# Patient Record
Sex: Male | Born: 1945
Health system: Southern US, Community
[De-identification: ages and names within clinical notes are randomized; demographics above are authoritative.]

## PROBLEM LIST (undated history)

## (undated) DIAGNOSIS — K219 Gastro-esophageal reflux disease without esophagitis: Secondary | ICD-10-CM

## (undated) DIAGNOSIS — I10 Essential (primary) hypertension: Secondary | ICD-10-CM

## (undated) DIAGNOSIS — Z955 Presence of coronary angioplasty implant and graft: Secondary | ICD-10-CM

## (undated) DIAGNOSIS — H35349 Macular cyst, hole, or pseudohole, unspecified eye: Secondary | ICD-10-CM

## (undated) DIAGNOSIS — M109 Gout, unspecified: Secondary | ICD-10-CM

## (undated) DIAGNOSIS — I2510 Atherosclerotic heart disease of native coronary artery without angina pectoris: Secondary | ICD-10-CM

## (undated) DIAGNOSIS — Z903 Acquired absence of stomach [part of]: Secondary | ICD-10-CM

## (undated) DIAGNOSIS — R7303 Prediabetes: Secondary | ICD-10-CM

## (undated) DIAGNOSIS — D126 Benign neoplasm of colon, unspecified: Secondary | ICD-10-CM

## (undated) DIAGNOSIS — Z87891 Personal history of nicotine dependence: Secondary | ICD-10-CM

## (undated) DIAGNOSIS — M797 Fibromyalgia: Secondary | ICD-10-CM

## (undated) DIAGNOSIS — Z789 Other specified health status: Secondary | ICD-10-CM

## (undated) DIAGNOSIS — E785 Hyperlipidemia, unspecified: Secondary | ICD-10-CM

## (undated) DIAGNOSIS — Z8719 Personal history of other diseases of the digestive system: Secondary | ICD-10-CM

## (undated) DIAGNOSIS — E669 Obesity, unspecified: Secondary | ICD-10-CM

## (undated) HISTORY — DX: Benign neoplasm of colon, unspecified: D12.6

## (undated) HISTORY — DX: Gout, unspecified: M10.9

## (undated) HISTORY — PX: CORONARY STENT PLACEMENT: SHX1402

## (undated) HISTORY — DX: Fibromyalgia: M79.7

## (undated) HISTORY — PX: STOMACH SURGERY: SHX791

## (undated) HISTORY — DX: Macular cyst, hole, or pseudohole, unspecified eye: H35.349

## (undated) HISTORY — DX: Gastro-esophageal reflux disease without esophagitis: K21.9

## (undated) HISTORY — DX: Other specified health status: Z78.9

## (undated) HISTORY — DX: Essential (primary) hypertension: I10

## (undated) HISTORY — PX: TONSILLECTOMY: SUR1361

## (undated) HISTORY — DX: Personal history of nicotine dependence: Z87.891

## (undated) HISTORY — DX: Obesity, unspecified: E66.9

## (undated) HISTORY — DX: Prediabetes: R73.03

## (undated) HISTORY — DX: Atherosclerotic heart disease of native coronary artery without angina pectoris: I25.10

---

## 2001-10-22 ENCOUNTER — Encounter: Admission: RE | Admit: 2001-10-22 | Discharge: 2001-12-10 | Payer: Self-pay | Admitting: Geriatric Medicine

## 2002-06-14 ENCOUNTER — Emergency Department (HOSPITAL_COMMUNITY): Admission: EM | Admit: 2002-06-14 | Discharge: 2002-06-14 | Payer: Self-pay | Admitting: Emergency Medicine

## 2002-06-14 ENCOUNTER — Encounter: Payer: Self-pay | Admitting: Emergency Medicine

## 2005-01-27 ENCOUNTER — Inpatient Hospital Stay (HOSPITAL_COMMUNITY): Admission: EM | Admit: 2005-01-27 | Discharge: 2005-01-29 | Payer: Self-pay | Admitting: *Deleted

## 2005-02-21 ENCOUNTER — Encounter (HOSPITAL_COMMUNITY): Admission: RE | Admit: 2005-02-21 | Discharge: 2005-05-22 | Payer: Self-pay | Admitting: Cardiology

## 2006-12-20 ENCOUNTER — Ambulatory Visit (HOSPITAL_COMMUNITY): Admission: RE | Admit: 2006-12-20 | Discharge: 2006-12-20 | Payer: Self-pay | Admitting: Cardiology

## 2007-07-12 ENCOUNTER — Emergency Department (HOSPITAL_COMMUNITY): Admission: EM | Admit: 2007-07-12 | Discharge: 2007-07-12 | Payer: Self-pay | Admitting: Emergency Medicine

## 2009-11-03 ENCOUNTER — Encounter: Admission: RE | Admit: 2009-11-03 | Discharge: 2009-11-03 | Payer: Self-pay | Admitting: Geriatric Medicine

## 2010-10-24 ENCOUNTER — Encounter: Payer: Self-pay | Admitting: Geriatric Medicine

## 2011-02-18 NOTE — Consult Note (Signed)
NAMEPETR, BONTEMPO               ACCOUNT NO.:  [id number]   MEDICAL RECORD NO.:  [id number]          PATIENT TYPE:  INP   LOCATION:  3715                         FACILITY:  [facility]   PHYSICIAN:  Francisca December, M.D.  DATE OF BIRTH:  1946-08-14   DATE OF CONSULTATION:  01/27/2005  DATE OF DISCHARGE:                                   CONSULTATION   REASON FOR CONSULTATION:  Chest pain.   HISTORY OF PRESENT ILLNESS:  Mr. Arthur Shaw is a pleasant 65 year old man  who had the onset today of dyspnea at rest.  He had been out walking the  dog.  It was around 4:00 in the morning.  He returned to bed and became  rapidly short of breath.  He had to sit up and do purselip breathing.  Shortly thereafter, he developed a pressure sensation in the epigastrium  that did not radiate.  It was not associated with nausea, but there was mild  diaphoresis and some tingling in the left arm.  The symptoms persisted for  approximately 45 minutes and then resolved.  He was brought to the emergency  room, and by the time of arrival he was asymptomatic.  Of note, he has had  stress myocardial perfusion studies done, last on January 25, 2004, that  showed a questionable inferobasal reversible defect felt most likely to be  diaphragmatic attenuation.  This was unchanged from a study done in 1997.   PAST MEDICAL HISTORY:  1.  Hypertension.  2.  Obesity.  3.  Questionable elevated lipids.  4.  Borderline diabetes.  5.  GERD.  6.  Seasonal allergies.  7.  Recent diagnosis of colon polyps.   MEDICATIONS:  1.  Metoprolol, ? dose.  2.  Norvasc, ? dose.  3.  Aspirin 81 mg p.o. daily.  4.  Nexium 40 mg p.o. daily.  5.  The patient is using frequent Afrin each evening for nasal congestion.   DRUG ALLERGIES:  None known.   FAMILY HISTORY:  Father has had coronary disease but not after the age of  21.  Mother has had heart failure and CVA.   SOCIAL HISTORY:  He is married.  Wife and children in the room  today.  He is  the Garment/textile technologist for [employer].  Uses no tobacco.   REVIEW OF SYSTEMS:  Significant for this chronic nasal congestion which is  felt to be allergic in nature.  He has been using Afrin each night.  Denies  any tachypalpitation.  Has attempted nasal steroids in the past without much  success.   PHYSICAL EXAMINATION:  VITAL SIGNS:  Blood pressure 143/93; pulse 58;  respirations 20; temperature 98.3.  GENERAL:  This is a well-appearing, mildly obese 65 year old man in no  distress.  HEENT:  Unremarkable.  NECK:  Supple, without thyromegaly or masses.  The carotid upstrokes are  normal.  There is no bruit.  There is no jugular venous distention.  CHEST:  Clear, with adequate excursion.  HEART:  Regular rhythm.  Normal S1 and S2.  No murmur, click, or rub.  ABDOMEN:  Soft, nontender.  No midline pulsatile mass.  No epigastric  tenderness.  No hepatomegaly.  EXTREMITIES:  Showed full range of motion.  No edema.  Intact distal pulses.  NEUROLOGIC:  Cranial nerves II-XII are intact.  Motor and sensory are  grossly intact.  Gait not tested.  SKIN:  Warm, dry, and clear.   ACCESSORY CLINICAL DATA:  Chest CT was negative for pulmonary embolism.  There was questionable lower lobe atelectasis.  Gallstones are present.  Questionable renal cyst.   Admission hemogram, serum electrolytes, BUN, creatinine, glucose, liver-  associated enzymes, D-dimer, and cardiac markers all negative.  Initial CK-  MB per the lab:  CK 54, MB 1, troponin less than 0.01.  Electrocardiogram:  Normal sinus rhythm, normal EKG.   IMPRESSION:  1.  Atypical angina, 65 year old man, multiple risk factors, currently in      high-stress position.  Thus far, he has ruled out for myocardial      infarction.  2.  Hypertension, marginal control.  3.  Modest obesity.  4.  GERD.   PLAN:  I have recommended that the patient undergo and he has accepted  cardiac catheterization, coronary angiography,  possible PCI.  Goals, risks,  and alternatives were discussed at length.  The patient states his  understanding, has had his questions answered, and wishes to proceed.  Will  administer aspirin and anxiolytics this evening.   Thank you very much for allowing me to assist in the care of Mr. Arthur Shaw.  It has been a pleasure to do so.  I will discuss his further care  with you.      JHE/MEDQ  D:  01/27/2005  T:  01/28/2005  Job:  [id number]   cc:   Hal T. Stoneking, M.D.  [address] [place], [place] [address]  Fax: [phone]

## 2011-02-18 NOTE — Cardiovascular Report (Signed)
Arthur Shaw, Arthur Shaw               ACCOUNT NO.:  [id number]   MEDICAL RECORD NO.:  [id number]          PATIENT TYPE:  OIB   LOCATION:  2899                         FACILITY:  [facility]   PHYSICIAN:  Francisca December, M.D.  DATE OF BIRTH:  06-Mar-1946   DATE OF PROCEDURE:  12/20/2006  DATE OF DISCHARGE:                            CARDIAC CATHETERIZATION   PROCEDURE PERFORMED:  1. Left heart catheterization.  2. Left ventriculogram.  3. Coronary angiography.   INDICATION:  Arthur Shaw is a 65 year old man with known ASCVD.  He is  approximately 23 months status post PCI with DE-stent implantation mid  LAD.  He has recently had an episode of atypical chest pain.  A  myocardial perfusion study was performed and showed a reversible  inferior inferoseptal defect.  The stress portion of the study was  normal.  He is brought to the catheterization laboratory at this time to  identify the extent of disease and provide further therapeutic options.   PROCEDURE NOTE:  The patient is brought to the cardiac catheterization  laboratory in the fasting state.  The right groin was prepped and draped  in the usual sterile fashion.  Local anesthesia was obtained with  infiltration of 1% lidocaine.  A 5-French catheter sheath was inserted  percutaneously into the right femoral artery utilizing an anterior  approach over a guiding J-wire.  Left heart catheterization and coronary  angiography then proceeded in the standard fashion using 5-French #4  left and right Judkins catheters and a 110-cm pigtail catheter.  A 30-  degree RAO cine left ventriculogram was formed utilizing a power  injector.  At the completion of the procedure the catheter and catheter  sheath were removed.  Hemostasis was achieved by direct pressure.  The  patient was transported to the recovery area in stable condition with an  intact distal pulse.   HEMODYNAMICS:  Systemic arterial pressure was 135/84 with a mean of 109  mmHg.   There was no systolic gradient across the aortic valve.  The left  ventricular end-diastolic pressure was 19 mmHg pre ventriculogram.   ANGIOGRAPHY:  The left ventriculogram demonstrated normal chamber size  and normal global systolic function without regional wall motion  abnormality.  A visual estimate of the ejection fraction is 65%.  There  is no coronary calcification seen.  The stent in the mid portion of the  anterior descending artery is easily visible.  There is no mitral  regurgitation and the aortic valve is trileaflet and opens normally  during systole.   There is a right-dominant coronary system present.  The main left  coronary artery is normal.   The left anterior descending artery and its branches are moderately  diseased; there is a 20% focal narrowing in the proximal portion of the  artery just before the origin of a large first diagonal branch, and then  there is another 20% stenosis in the mid portion of the artery just  before the stented segment.  The stented segment itself is widely patent  but does demonstrate a small amount of in-stent restenosis in the 20-25%  range in the mid portion of the stent.  There is a second diagonal  branch which is moderate in size which arises from within the stented  segment and is widely patent.  There is a 30% stenosis at its origin.  The ongoing anterior descending artery is relatively small, reaches but  does not traverse the apex.   The left circumflex coronary artery and its branches are without  significant obstruction.  The vessel gives rise to two moderate-size  marginal branches.  The first marginal branch bifurcates into two  smaller branches.  The second marginal branch is without obstruction and  on the true obtuse margin of the heart.  There are no obstructions of a  significance seen in the left circumflex system.   The right coronary artery is very large and dominant.  There is a mid  vessel 20% stenosis which  is focal just before the origin of the right  ventricular branch.  There are luminal irregularities in the rest of the  mid portion and in the distal portion of the right coronary.  The distal  vessel bifurcates into a large posterior descending artery which extends  to and perfuses the apex.  There is also a fairly large posterolateral  segment which gives rise to three small- to moderate-sized left  ventricular branches.  There are no obstructions in these distal  branches of the right coronary.   Collateral vessels are not seen.   FINAL IMPRESSION:  1. Atherosclerotic cardiovascular disease, single vessel.  2. Widely patent coronary arteries in general.  3. Intact left ventricular size and global systolic function, ejection      fraction 65%.  4. Mildly elevated left ventricular end-diastolic pressure.  5. Falsely abnormal exercise Cardiolite perfusion images.   PLAN:  The patient is presented with this gratifying news.  No changes  will be made in his medical therapy.  He will continue routine followup  with myself.      Francisca December, M.D.  Electronically Signed     JHE/MEDQ  D:  12/20/2006  T:  12/20/2006  Job:  [id number]   cc:   Hal T. Stoneking, M.D.

## 2011-02-18 NOTE — H&P (Signed)
NAMESAHEJ, SCHRIEBER NO.:  [id number]   MEDICAL RECORD NO.:  [id number]          PATIENT TYPE:  EMS   LOCATION:  MAJO                         FACILITY:  [facility]   PHYSICIAN:  Deirdre Peer. Polite, M.D. DATE OF BIRTH:  1946-06-10   DATE OF ADMISSION:  01/27/2005  DATE OF DISCHARGE:                                HISTORY & PHYSICAL   CHIEF COMPLAINT:  Chest pain.   HISTORY OF PRESENT ILLNESS:  Mr. Jowers is a 65 year old male with known  history of hypertension, borderline hypercholesterolemia, borderline  diabetes, morbid obesity who presents to the ED after having an episode of  chest pain this a.m.  Patient states he was in his usual state of health  this a.m.  He got up approximately 4 a.m. to go out and walk the dog.  Patient stated he did not walk no further than the front yard but when he  came back into the house had a sensation of chest pressure 2/10 with some  radiation to the right side of the chest and some tingling in his left arm.  Patient also experienced some shortness of breath and diaphoresis.  Patient  stated symptoms lasted for approximately half an hour.  Patient's wife  brought him to the ED.  En route to the ED patient's symptoms abated.  In  the ED the patient was pain-free.  EKG and point of care enzymes were within  normal limits.  Because of the patient's risk factors and known family  history of coronary artery disease, [facility] hospitalists have been called for  further evaluation and treatment.  At the time of my evaluation patient was  alert and oriented x3, no apparent distress.  Symptoms as stated above.  Patient states that he normally does not have any orthopnea or PND, no leg  swelling.  Patient states that he can walk several blocks without any  symptoms.  Patient has had chest pain symptoms before which necessitated a  stress test less than 12 months ago performed by Dr. Garnette Scheuermann.  According  to the patient results were within normal  limits.  However, again as stated,  because of the patient's multiple risk factors admission is deemed necessary  for further evaluation and treatment.   PAST MEDICAL HISTORY:  1.  As stated above, significant for hypertension.  2.  Obesity.  3.  Seasonal allergies.  4.  Borderline hypercholesterolemia.  5.  Borderline diabetes.  6.  Recent diagnosis of colonic polyps.  7.  GERD.   MEDICATIONS:  1.  Metoprolol b.i.d.  Patient is unsure of his dose.  2.  Aspirin 81 mg daily.  3.  Norvasc.  The patient is unsure of his dose.  4.  Nexium daily.  5.  Afrin p.r.n. which the patient states he uses on a daily basis.   SOCIAL HISTORY:  Social alcohol.  No tobacco.  No drugs.   PAST SURGICAL HISTORY:  None.   FAMILY HISTORY:  Father known history of coronary artery disease, status  post CABG, now with CHF.  Mother history of CHF and CVA.  Brother  known  history of coronary artery disease.   REVIEW OF SYSTEMS:  As stated in HPI, otherwise negative.   PHYSICAL EXAMINATION:  VITAL SIGNS:  Temperature 97.3, blood pressure  152/98, pulse 79, respiratory rate 18, saturation 100%.  HEENT:  Pupils equal, round, reactive to light.  Anicteric sclera.  No oral  lesions.  No nodes.  No JVD.  CHEST:  Clear to auscultation bilaterally.  CARDIOVASCULAR:  Regular.  No S3.  ABDOMEN:  Obese.  No hepatosplenomegaly.  EXTREMITIES:  No clubbing, cyanosis, edema.  2+ pulse.  NEUROLOGIC:  Nonfocal.   DATA:  Chest x-ray shows bibasilar nodular densities.  Recommend CT.  BMET:  Sodium 133, potassium 6.5, chloride 107, BUN 15, creatinine 0.8.  Point of  care enzyme:  Myoglobin 83, CK-MB less than 1, troponin I less than 0.05.  D-  dimer less than 0.22.  AST and ALT 20 and 13.  CBC:  White count 8.1,  hemoglobin 15.9, hematocrit 44, platelet 250.  Repeat BMET shows corrected  potassium of 3.7, glucose of 159.  Reported potassium of 6.5 was on an I-  stat.   ASSESSMENT:  1.  Chest pain in a patient with  multiple risk factors for coronary artery      disease.  Please note patient has had a negative stress test within the      last 12 months.  2.  Hypertension.  3.  Borderline high cholesterol.  4.  Borderline diabetes.  5.  Morbid obesity.  6.  Gastroesophageal reflux disease.  7.  Abnormal chest x-ray showing bibasilar nodular densities.  8.  Hyperkalemia.  However, please note repeat laboratory shows potassium      within normal limits.   RECOMMENDATIONS:  Recommend patient be admitted to a medicine telemetry bed.  Patient will have serial cardiac enzymes, will be started on aspirin, beta  blocker.  Will check lipids, hemoglobin A1C, and a TSH.  Patient will have  cardiac evaluation for further risk stratification.  As the patient had an  abnormal chest x-ray, will obtain a CT as recommended to rule out any  underlying pathology.  Will make further recommendations after review of the  above studies.  Thank you in advance.      RDP/MEDQ  D:  01/27/2005  T:  01/27/2005  Job:  [id number]   cc:   Hal T. Stoneking, M.D.  [address] [place], [place] [address]  Fax: [phone]

## 2011-02-18 NOTE — Cardiovascular Report (Signed)
Arthur Shaw, Arthur Shaw               ACCOUNT NO.:  [id number]   MEDICAL RECORD NO.:  [id number]          PATIENT TYPE:  INP   LOCATION:  6524                         FACILITY:  [facility]   PHYSICIAN:  Francisca December, M.D.  DATE OF BIRTH:  1945/11/19   DATE OF PROCEDURE:  01/28/2005  DATE OF DISCHARGE:                              CARDIAC CATHETERIZATION   PROCEDURE PERFORMED:  1.  Left heart catheterization.  2.  Left ventriculogram.  3.  Coronary angiography.  4.  PCI/drug-eluting stent implantation mid anterior descending artery.  5.  Percutaneous closure right femoral artery.   INDICATION:  Mr. Arthur Shaw is a 65 year old man who was admitted  yesterday with atypical angina and dyspnea.  He ruled out for myocardial  infarction.  Has had previous myocardial perfusion studies that were  questionable for inferior wall defect.  He is brought now to the cardiac  catheterization laboratory to identify the extent of disease and provide for  further therapeutic options.   PROCEDURAL NOTE:  The patient was brought to the cardiac catheterization  laboratory in the fasting state.  The right groin was prepped and draped in  the usual sterile fashion.  Local anesthesia was obtained with the  infiltration of 1% lidocaine.  A 5 French catheter sheath was inserted  percutaneously into the right femoral artery utilizing an anterior approach  over a guiding J wire.  Left heart catheterization and coronary angiography  were then conducted using 5 French pigtail and #4 left and right Judkins  catheters in a standard fashion.  The patient did receive sublingual  nitroglycerin 0.4 mg prior to the coronary angiography.  A left  ventriculogram was performed in the 30-degree RAO angulation using a power  injector.  Diagnostic images were reviewed and a 90% stenosis in the mid  anterior descending artery at the origin of the second diagonal branch was  identified.  I proceeded with percutaneous  intervention.  The 5 French  catheter sheath was exchanged for 6 French catheter sheath over long guiding  J wire.  The patient received 5900 units of heparin intravenously resulting  in an ACT of 306 seconds. He also received a double bolus and constant  infusion of integrilin. A 6 French #3.0 CLS guiding catheter was advanced to  the ascending aorta where the left coronary os was engaged.  A 0.014-inch  Luge intracoronary guide wire was passed across the lesion in the distal LAD  without difficulty.  A second Luge guide wire was advanced into the diagonal  branch.  The lesion was primarily stented using a 2.75/16-mm Scimed Taxus  intracoronary drug-eluting stent.  This was carefully positioned across the  lesion and inflated to 12 atmospheres for 40 seconds.  This stent balloon  was deflated and removed.  The diagonal wire was then removed from the  diagonal.  An attempt to pass it through the stent struts and into the  diagonal was unsuccessful.  A 0.014-inch Whisper wire was successfully  crossed into the diagonal. However, I was unable to advance a 2.0 or a 1.5  mm Maverick balloon.  Finally,  the diagonal wire was removed and the stent  was post dilated using a 3.0/12 mm Quantum Maverick inflated to 12  atmospheres for 30 seconds.  This balloon was deflated and removed and the  Whisper wire was returned to the circulation again crossing into the  diagonal branch which was now subtotally occluded with significantly reduced  flow.  At this point, I was able to successfully pass a 1.5/15 mm Maverick  into the diagonal branch.  This was inflated to 9 atmospheres for 19  seconds.  It was deflated and removed and a 2.0/12 mm Maverick intracoronary  balloon was positioned into the ostium of the diagonal and inflated three  times to a peak pressure of 6 atmospheres for not greater than 30 seconds.  Finally, this balloon and wire were removed and a 3.0/12 mm Quantum Maverick  again advanced  into the stented LAD segment and inflated to 9 atmospheres  for 40 seconds.  Finally, this balloon was deflated and removed, and after  confirming adequate patency in orthogonal views both with and without the  guide wire in place, the guiding catheter was removed.  A right femoral  arteriogram in a 45-degree RAO angulation via the catheter sheath by hand  injection documented adequate anatomy for placement of the percutaneous  closure device Angio-Seal.  This was successfully deployed with good  hemostasis and an intact distal pulse.  The patient was transported to the  recovery area in stable condition with an intact distal pulse.   HEMODYNAMICS:  Systemic arterial pressure was 135/88 with a of 110 mmHg.  There was no systolic gradient across the aortic valve.  The left  ventricular end-diastolic pressure was 18 mmHg pre ventriculogram.   ANGIOGRAPHY:  The left ventriculogram demonstrated normal chamber size and  normal global systolic function without regional wall motion abnormality.  A  visual estimate of the ejection fraction is 65%.  There is no mitral  regurgitation.  There was no coronary calcification and the aortic valve was  trileaflet and opened normally during systole.   There was a right dominant coronary system present.  The main left coronary  artery was normal.   The left anterior descending artery and its branches were highly diseased.  There is a 20% proximal stenosis followed by the origin of a large first  diagonal branch.  Then between the first and second diagonal branches there  is a 20-30% stenosis.  After the second diagonal branch, but involving the  origin of the diagonal there was a 90% LAD stenosis.  There was a 30%  stenosis in the origin of a first septal perforator that arose just before  the origin of the second diagonal branch.  The second diagonal branch did  not have any significant obstruction in its origin.  The ongoing anterior descending artery  reached, but did not traverse the apex.   The left circumflex coronary artery and its branches were normal.  The  dominant vessel on the lateral wall is the first diagonal.  The circumflex  was relatively small giving rise to only two marginal branches.  The first  of which is relatively small.  The second one is moderate in size and was on  the posterior wall from the heart from the mid to the base only.   The right coronary artery and its branches were large and dominant.  There  was a 20% mid vessel stenosis and luminal irregularities in the mid and  distal segment, but no significant  obstruction.  The distal segment  bifurcates into a very large posterior descending artery and a large  posterior lateral segment with two large left ventricular branches.  The  posterior descending artery and posterior lateral branches contain no  obstructions.  The posterior descending artery perfuses the apex.   Following balloon dilatation, stent implantation in the anterior descending  artery there was a 10% residual stenosis which I accepted rather than high  pressure dilate the LAD stent and subsequently occlude the diagonal again.  There was a 60% residual stenosis in the origin of the diagonal.   FINAL IMPRESSION:  1.  Atherosclerotic coronary vascular disease, single vessel.  2.  Intact left ventricular size and global systolic function.  3.  Status post successful percutaneous coronary intervention/drug-eluting      stent implantation mid anterior descending artery with adjunctive      balloon angioplasty of a second diagonal branch placed in stent jail.  4.  Typical angina was not reproduced with device insertion or balloon      inflation.      JHE/MEDQ  D:  01/28/2005  T:  01/28/2005  Job:  [id number]   cc:   Hal T. Stoneking, M.D.  [address] [place], [place] [address]  Fax: [phone]

## 2011-07-14 LAB — URINALYSIS, ROUTINE W REFLEX MICROSCOPIC
Bilirubin Urine: NEGATIVE
Glucose, UA: 100 — AB
Hgb urine dipstick: NEGATIVE
Ketones, ur: NEGATIVE
Nitrite: NEGATIVE
Protein, ur: NEGATIVE
Specific Gravity, Urine: 1.01
Urobilinogen, UA: 0.2
pH: 6.5

## 2011-07-14 LAB — COMPREHENSIVE METABOLIC PANEL
Albumin: 3.6
Alkaline Phosphatase: 85
BUN: 13
Chloride: 102
Creatinine, Ser: 0.9
GFR calc non Af Amer: >60
Glucose, Bld: 227 — ABNORMAL HIGH
Potassium: 3.5
Total Bilirubin: 0.8

## 2011-07-14 LAB — DIFFERENTIAL
Basophils Absolute: 0.1
Basophils Relative: 1
Lymphocytes Relative: 19
Monocytes Absolute: 0.6
Neutro Abs: 7.1
Neutrophils Relative %: 72

## 2011-07-14 LAB — CBC
HCT: 43.1
MCHC: 32.7
Platelets: 273
RDW: 17.3 — ABNORMAL HIGH

## 2011-07-14 LAB — CK TOTAL AND CKMB (NOT AT ARMC): CK, MB: 1.3

## 2011-07-14 LAB — TROPONIN I: Troponin I: 0.02

## 2011-12-06 ENCOUNTER — Emergency Department (INDEPENDENT_AMBULATORY_CARE_PROVIDER_SITE_OTHER): Payer: 59

## 2011-12-06 ENCOUNTER — Emergency Department (HOSPITAL_BASED_OUTPATIENT_CLINIC_OR_DEPARTMENT_OTHER)
Admission: EM | Admit: 2011-12-06 | Discharge: 2011-12-06 | Disposition: A | Discharge status: Home / Self Care | Payer: 59 | Attending: Emergency Medicine | Admitting: Emergency Medicine

## 2011-12-06 ENCOUNTER — Encounter (HOSPITAL_BASED_OUTPATIENT_CLINIC_OR_DEPARTMENT_OTHER): Payer: Self-pay | Admitting: *Deleted

## 2011-12-06 ENCOUNTER — Other Ambulatory Visit: Payer: Self-pay

## 2011-12-06 DIAGNOSIS — K7689 Other specified diseases of liver: Secondary | ICD-10-CM

## 2011-12-06 DIAGNOSIS — R079 Chest pain, unspecified: Secondary | ICD-10-CM | POA: Insufficient documentation

## 2011-12-06 DIAGNOSIS — K219 Gastro-esophageal reflux disease without esophagitis: Secondary | ICD-10-CM

## 2011-12-06 DIAGNOSIS — R110 Nausea: Secondary | ICD-10-CM

## 2011-12-06 DIAGNOSIS — I10 Essential (primary) hypertension: Secondary | ICD-10-CM | POA: Insufficient documentation

## 2011-12-06 DIAGNOSIS — D3500 Benign neoplasm of unspecified adrenal gland: Secondary | ICD-10-CM

## 2011-12-06 DIAGNOSIS — E785 Hyperlipidemia, unspecified: Secondary | ICD-10-CM | POA: Insufficient documentation

## 2011-12-06 DIAGNOSIS — M549 Dorsalgia, unspecified: Secondary | ICD-10-CM

## 2011-12-06 DIAGNOSIS — J9819 Other pulmonary collapse: Secondary | ICD-10-CM

## 2011-12-06 DIAGNOSIS — M546 Pain in thoracic spine: Secondary | ICD-10-CM | POA: Insufficient documentation

## 2011-12-06 HISTORY — DX: Hyperlipidemia, unspecified: E78.5

## 2011-12-06 LAB — LIPASE, BLOOD: Lipase: 31 U/L (ref 11–59)

## 2011-12-06 LAB — CARDIAC PANEL(CRET KIN+CKTOT+MB+TROPI)
CK, MB: 1.8 ng/mL (ref 0.3–4.0)
CK, MB: 1.9 ng/mL (ref 0.3–4.0)
Total CK: 61 U/L (ref 7–232)
Total CK: 61 U/L (ref 7–232)
Troponin I: <0.3 ng/mL (ref <0.30)
Troponin I: <0.3 ng/mL (ref <0.30)

## 2011-12-06 LAB — COMPREHENSIVE METABOLIC PANEL
ALT: 14 U/L (ref 0–53)
Albumin: 3.8 g/dL (ref 3.5–5.2)
Calcium: 9.2 mg/dL (ref 8.4–10.5)
GFR calc Af Amer: >90 mL/min (ref >90)
Glucose, Bld: 168 mg/dL — ABNORMAL HIGH (ref 70–99)
Potassium: 3.3 mEq/L — ABNORMAL LOW (ref 3.5–5.1)
Sodium: 136 mEq/L (ref 135–145)
Total Protein: 7.2 g/dL (ref 6.0–8.3)

## 2011-12-06 LAB — DIFFERENTIAL
Eosinophils Absolute: 0.5 10*3/uL (ref 0.0–0.7)
Lymphocytes Relative: 27 % (ref 12–46)
Lymphs Abs: 2.7 10*3/uL (ref 0.7–4.0)
Monocytes Relative: 8 % (ref 3–12)
Neutro Abs: 6 10*3/uL (ref 1.7–7.7)
Neutrophils Relative %: 60 % (ref 43–77)

## 2011-12-06 LAB — CBC
Hemoglobin: 14.6 g/dL (ref 13.0–17.0)
MCH: 28.6 pg (ref 26.0–34.0)
Platelets: 241 10*3/uL (ref 150–400)
RBC: 5.11 MIL/uL (ref 4.22–5.81)
WBC: 10 10*3/uL (ref 4.0–10.5)

## 2011-12-06 MED ORDER — IOHEXOL 350 MG/ML SOLN
80.0000 mL | Freq: Once | INTRAVENOUS | Status: AC | PRN
  Administered 2011-12-06: 80 mL via INTRAVENOUS

## 2011-12-06 MED ORDER — SUCRALFATE 1 GM/10ML PO SUSP: 1.0000 g | Freq: Four times a day (QID) | ORAL | Status: DC

## 2011-12-06 MED ORDER — GI COCKTAIL ~~LOC~~
30.0000 mL | Freq: Once | ORAL | Status: AC
  Administered 2011-12-06: 30 mL via ORAL

## 2011-12-06 MED ORDER — FENTANYL CITRATE 0.05 MG/ML IJ SOLN
50.0000 ug | Freq: Once | INTRAMUSCULAR | Status: DC
  Filled 2011-12-06: qty 2

## 2011-12-06 MED ORDER — GI COCKTAIL ~~LOC~~
ORAL | Status: AC
  Filled 2011-12-06: qty 30

## 2011-12-06 NOTE — ED Notes (Signed)
Pt refuses offer of fentanyl or any other narcotic. States he has to work later today. EDP made aware and order discontinued.

## 2011-12-06 NOTE — ED Notes (Signed)
Patient states that he woke up with upper back pain, between his shoulder blades. Left shoulder pain that radiates to his left arm. Also states that he is nausea as well.

## 2011-12-06 NOTE — ED Provider Notes (Signed)
History     CSN: [id number]  Arrival date & time 12/06/11  0210   First MD Initiated Contact with Patient 12/06/11 [phone]      Chief Complaint  Patient presents with  . Back Pain    (Consider location/radiation/quality/duration/timing/severity/associated sxs/prior treatment) Patient is a 66 y.o. male presenting with back pain. The history is provided by the patient. No language interpreter was used.  Back Pain  This is a new problem. The current episode started 1 to 2 hours ago. The problem occurs constantly. The problem has not changed since onset.The pain is associated with no known injury. The pain is present in the thoracic spine. The quality of the pain is described as stabbing. Radiates to: LUE. The pain is at a severity of 10/10. The pain is severe. Exacerbated by: nothing. Worse during: awoke from sleep with associated nausea no SOB. Stiffness is present: none. Pertinent negatives include no fever, no numbness, no headaches, no abdominal pain, no perianal numbness, no bladder incontinence, no leg pain, no paresthesias and no weakness. He has tried nothing for the symptoms. The treatment provided no relief. Risk factors include obesity.    Past Medical History  Diagnosis Date  . Hypertension   . Hyperlipidemia     Past Surgical History  Procedure Date  . Coronary stent placement     No family history on file.  History  Substance Use Topics  . Smoking status: Never Smoker   . Smokeless tobacco: Not on file  . Alcohol Use: No      Review of Systems  Constitutional: Negative for fever.  HENT: Negative.   Eyes: Negative.   Respiratory: Negative for shortness of breath.   Cardiovascular: Negative for palpitations and leg swelling.  Gastrointestinal: Positive for nausea. Negative for abdominal pain and abdominal distention.  Genitourinary: Negative.  Negative for bladder incontinence.  Musculoskeletal: Positive for back pain.  Skin: Negative.   Neurological: Negative  for weakness, numbness, headaches and paresthesias.  Hematological: Negative.   Psychiatric/Behavioral: Negative.     Allergies  Statins  Home Medications   Current Outpatient Rx  Name Route Sig Dispense Refill  . AMLODIPINE BESY-BENAZEPRIL HCL 5-40 MG PO CAPS Oral Take 1 capsule by mouth daily.    . ASPIRIN 81 MG PO TABS Oral Take 81 mg by mouth daily.    . AZELASTINE HCL 137 MCG/SPRAY NA SOLN Nasal Place 1 spray into the nose 2 (two) times daily. Use in each nostril as directed    . CELECOXIB 200 MG PO CAPS Oral Take 200 mg by mouth 2 (two) times daily.    Marland Kitchen CLOPIDOGREL BISULFATE 75 MG PO TABS Oral Take 75 mg by mouth daily.    . CYCLOBENZAPRINE HCL 10 MG PO TABS Oral Take 10 mg by mouth 3 (three) times daily as needed.    Marland Kitchen ESZOPICLONE 2 MG PO TABS Oral Take 2 mg by mouth at bedtime. Take immediately before bedtime    . EZETIMIBE 10 MG PO TABS Oral Take 10 mg by mouth daily.    . FUROSEMIDE 20 MG PO TABS Oral Take 20 mg by mouth 2 (two) times daily.    Marland Kitchen METAXALONE 800 MG PO TABS Oral Take 800 mg by mouth 3 (three) times daily.    Marland Kitchen METOPROLOL TARTRATE 50 MG PO TABS Oral Take 50 mg by mouth 2 (two) times daily.    Marland Kitchen NITROGLYCERIN 0.4 MG/HR TD PT24 Transdermal Place 1 patch onto the skin daily.    Marland Kitchen PANTOPRAZOLE  SODIUM 40 MG PO TBEC Oral Take 40 mg by mouth daily.    Marland Kitchen POLYETHYLENE GLYCOL 3350 PO PACK Oral Take 17 g by mouth daily.    Marland Kitchen ROSUVASTATIN CALCIUM 10 MG PO TABS Oral Take 10 mg by mouth daily.    . TRIAMCINOLONE ACETONIDE 55 MCG/ACT NA INHA Nasal Place 2 sprays into the nose daily.      BP 150/81  Pulse 82  Temp(Src) 98.2 F (36.8 C) (Oral)  Resp 18  SpO2 96%  Physical Exam  Constitutional: He is oriented to person, place, and time. He appears well-developed and well-nourished. No distress.  HENT:  Head: Normocephalic.  Mouth/Throat: Oropharynx is clear and moist.  Eyes: Conjunctivae are normal. Pupils are equal, round, and reactive to light.  Neck: Normal range  of motion. Neck supple.  Cardiovascular: Normal rate and regular rhythm.   Pulmonary/Chest: Effort normal and breath sounds normal. He has no wheezes. He has no rales.  Abdominal: Soft. Bowel sounds are normal. There is no tenderness. There is no rebound and no guarding.  Musculoskeletal: Normal range of motion.  Neurological: He is alert and oriented to person, place, and time.  Skin: Skin is warm and dry.  Psychiatric: He has a normal mood and affect.    ED Course  Procedures (including critical care time)  Labs Reviewed  CBC - Abnormal; Notable for the following:    RDW 16.5 (*)    All other components within normal limits  COMPREHENSIVE METABOLIC PANEL - Abnormal; Notable for the following:    Potassium 3.3 (*)    Glucose, Bld 168 (*)    GFR calc non Af Amer 89 (*)    All other components within normal limits  D-DIMER, QUANTITATIVE - Abnormal; Notable for the following:    D-Dimer, Quant 1.21 (*)    All other components within normal limits  DIFFERENTIAL  CARDIAC PANEL(CRET KIN+CKTOT+MB+TROPI)  LIPASE, BLOOD  CARDIAC PANEL(CRET KIN+CKTOT+MB+TROPI)   Dg Chest 2 View  12/06/2011  *RADIOLOGY REPORT*  Clinical Data: Sudden onset upper back pain, nausea, history hypertension and hyperlipidemia  CHEST - 2 VIEW  Comparison: 01/27/2005  Findings: Normal heart size, mediastinal contours, and pulmonary vascularity. Bronchitic changes with minimal basilar atelectasis. No acute infiltrate, pleural effusion or pneumothorax. Bowel interposition between liver and diaphragm. Eventration anterior right diaphragm. End plate spur formation thoracic spine.  IMPRESSION: Chronic lung changes with minimal right basilar atelectasis.  Original Report Authenticated By: Lollie Marrow, M.D.     No diagnosis found.    MDM   Date: 12/06/2011  Rate: 91  Rhythm: normal sinus rhythm  QRS Axis: normal  Intervals: normal  ST/T Wave abnormalities: normal  Conduction Disutrbances:none  Narrative  Interpretation:   Old EKG Reviewed: none available    Pain relieved with GI cocktail.  2 negative sets of troponins but will need close follow up/   618 case d/w Dr. Mayford Knife call office at 830 am say we spoke for same day appointment'  Return for chest pain shortness of breath abdominal pain or any concerns.  Stay away from greasy and spicy food.  Patient and wife verbalize understanding and agree to follow up     Michele Kerlin Smitty Cords, MD 12/06/11 [phone]

## 2011-12-06 NOTE — Discharge Instructions (Signed)
Diet for GERD or PUD Nutrition therapy can help ease the discomfort of gastroesophageal reflux disease (GERD) and peptic ulcer disease (PUD).  HOME CARE INSTRUCTIONS   Eat your meals slowly, in a relaxed setting.   Eat 5 to 6 small meals per day.   If a food causes distress, stop eating it for a period of time.  FOODS TO AVOID  Coffee, regular or decaffeinated.   Cola beverages, regular or low calorie.   Tea, regular or decaffeinated.   Pepper.   Cocoa.   High fat foods, including meats.   Butter, margarine, hydrogenated oil (trans fats).   Peppermint or spearmint (if you have GERD).   Fruits and vegetables if not tolerated.   Alcohol.   Nicotine (smoking or chewing). This is one of the most potent stimulants to acid production in the gastrointestinal tract.   Any food that seems to aggravate your condition.  If you have questions regarding your diet, ask your caregiver or a registered dietitian. TIPS  Lying flat may make symptoms worse. Keep the head of your bed raised 6 to 9 inches (15 to 23 cm) by using a foam wedge or blocks under the legs of the bed.   Do not lay down until 3 hours after eating a meal.   Daily physical activity may help reduce symptoms.  MAKE SURE YOU:   Understand these instructions.   Will watch your condition.   Will get help right away if you are not doing well or get worse.  Document Released: 09/19/2005 Document Revised: 09/08/2011 Document Reviewed: 08/05/2011 Exit[employer] Patient Information 2012 [employer]. 

## 2011-12-06 NOTE — ED Notes (Signed)
Dr Nicanor Alcon at bedside speaking with pt regarding test results.

## 2013-01-28 DIAGNOSIS — IMO0001 Reserved for inherently not codable concepts without codable children: Secondary | ICD-10-CM | POA: Diagnosis not present

## 2013-01-28 DIAGNOSIS — I10 Essential (primary) hypertension: Secondary | ICD-10-CM | POA: Diagnosis not present

## 2013-01-28 DIAGNOSIS — E669 Obesity, unspecified: Secondary | ICD-10-CM | POA: Diagnosis not present

## 2013-02-28 DIAGNOSIS — Z0189 Encounter for other specified special examinations: Secondary | ICD-10-CM | POA: Diagnosis not present

## 2013-03-07 DIAGNOSIS — H35349 Macular cyst, hole, or pseudohole, unspecified eye: Secondary | ICD-10-CM | POA: Diagnosis not present

## 2013-03-07 DIAGNOSIS — H43829 Vitreomacular adhesion, unspecified eye: Secondary | ICD-10-CM | POA: Diagnosis not present

## 2013-03-07 DIAGNOSIS — H2510 Age-related nuclear cataract, unspecified eye: Secondary | ICD-10-CM | POA: Diagnosis not present

## 2013-04-23 DIAGNOSIS — M62838 Other muscle spasm: Secondary | ICD-10-CM | POA: Diagnosis not present

## 2013-05-20 DIAGNOSIS — E7800 Pure hypercholesterolemia, unspecified: Secondary | ICD-10-CM | POA: Diagnosis present

## 2013-05-20 DIAGNOSIS — R609 Edema, unspecified: Secondary | ICD-10-CM | POA: Diagnosis present

## 2013-05-20 DIAGNOSIS — I10 Essential (primary) hypertension: Secondary | ICD-10-CM | POA: Diagnosis present

## 2013-05-20 DIAGNOSIS — Z9861 Coronary angioplasty status: Secondary | ICD-10-CM | POA: Diagnosis not present

## 2013-05-20 DIAGNOSIS — K420 Umbilical hernia with obstruction, without gangrene: Secondary | ICD-10-CM | POA: Diagnosis present

## 2013-05-20 DIAGNOSIS — I2510 Atherosclerotic heart disease of native coronary artery without angina pectoris: Secondary | ICD-10-CM | POA: Diagnosis present

## 2013-05-20 DIAGNOSIS — I519 Heart disease, unspecified: Secondary | ICD-10-CM | POA: Diagnosis present

## 2013-05-20 DIAGNOSIS — Z6841 Body Mass Index (BMI) 40.0 and over, adult: Secondary | ICD-10-CM | POA: Diagnosis not present

## 2013-05-20 DIAGNOSIS — K5900 Constipation, unspecified: Secondary | ICD-10-CM | POA: Diagnosis present

## 2013-05-20 DIAGNOSIS — M109 Gout, unspecified: Secondary | ICD-10-CM | POA: Diagnosis present

## 2013-05-20 DIAGNOSIS — G4700 Insomnia, unspecified: Secondary | ICD-10-CM | POA: Diagnosis present

## 2013-05-20 DIAGNOSIS — E559 Vitamin D deficiency, unspecified: Secondary | ICD-10-CM | POA: Diagnosis present

## 2013-05-20 DIAGNOSIS — K219 Gastro-esophageal reflux disease without esophagitis: Secondary | ICD-10-CM | POA: Diagnosis present

## 2013-05-20 DIAGNOSIS — E785 Hyperlipidemia, unspecified: Secondary | ICD-10-CM | POA: Diagnosis present

## 2013-05-20 DIAGNOSIS — K449 Diaphragmatic hernia without obstruction or gangrene: Secondary | ICD-10-CM | POA: Diagnosis present

## 2013-05-20 DIAGNOSIS — N529 Male erectile dysfunction, unspecified: Secondary | ICD-10-CM | POA: Diagnosis present

## 2013-11-21 DIAGNOSIS — Z9884 Bariatric surgery status: Secondary | ICD-10-CM | POA: Diagnosis not present

## 2013-11-21 DIAGNOSIS — K5900 Constipation, unspecified: Secondary | ICD-10-CM | POA: Diagnosis not present

## 2013-11-21 DIAGNOSIS — Z6841 Body Mass Index (BMI) 40.0 and over, adult: Secondary | ICD-10-CM | POA: Diagnosis not present

## 2013-12-12 ENCOUNTER — Ambulatory Visit: Payer: 59 | Admitting: Interventional Cardiology

## 2013-12-14 DIAGNOSIS — M79609 Pain in unspecified limb: Secondary | ICD-10-CM | POA: Diagnosis not present

## 2013-12-22 DIAGNOSIS — S6000XA Contusion of unspecified finger without damage to nail, initial encounter: Secondary | ICD-10-CM | POA: Diagnosis not present

## 2013-12-22 DIAGNOSIS — L089 Local infection of the skin and subcutaneous tissue, unspecified: Secondary | ICD-10-CM | POA: Diagnosis not present

## 2013-12-23 ENCOUNTER — Ambulatory Visit (INDEPENDENT_AMBULATORY_CARE_PROVIDER_SITE_OTHER): Payer: 59

## 2013-12-23 ENCOUNTER — Ambulatory Visit: Payer: Self-pay

## 2013-12-23 VITALS — BP 81/55 | HR 69 | Resp 18

## 2013-12-23 DIAGNOSIS — S90129A Contusion of unspecified lesser toe(s) without damage to nail, initial encounter: Secondary | ICD-10-CM

## 2013-12-23 DIAGNOSIS — L600 Ingrowing nail: Secondary | ICD-10-CM

## 2013-12-23 DIAGNOSIS — M79609 Pain in unspecified limb: Secondary | ICD-10-CM

## 2013-12-23 NOTE — Patient Instructions (Signed)
Betadine Soak Instructions  Purchase an 8 oz. bottle of BETADINE solution (Povidone)  THE DAY AFTER THE PROCEDURE  Place 1 tablespoon of betadine solution in a quart of warm tap water.  Submerge your foot or feet with outer bandage intact for the initial soak; this will allow the bandage to become moist and wet for easy lift off.  Once you remove your bandage, continue to soak in the solution for 20 minutes.  This soak should be done twice a day.  Next, remove your foot or feet from solution, blot dry the affected area and cover.  You may use a band aid large enough to cover the area or use gauze and tape.  Apply other medications to the area as directed by the doctor such as cortisporin otic solution (ear drops) or neosporin.  IF YOUR SKIN BECOMES IRRITATED WHILE USING THESE INSTRUCTIONS, IT IS OKAY TO SWITCH TO EPSOM SALTS AND WATER OR WHITE VINEGAR AND WATER.  Recommended Tylenol as needed for pain, starting with the first soaks and thereafter reapplied Neosporin Polysporin and a Band-Aid dressing daily

## 2013-12-23 NOTE — Progress Notes (Signed)
° °  Subjective:    Patient ID: EILEEN CROSWELL, male    DOB: 02-14-1946, 68 y.o.   MRN: [id number]  HPI I dropped a suitcase on my right foot in December of last year and just last week it started hurting and I went to the doctor and they put him on an antibotic and I am a Engineer, structural and I had my gun in my pocket and went to doctor yesterday and they said it needs to come off and I took aleve and doesn't hurt as bad today and discolored     Review of Systems  Allergic/Immunologic: Positive for environmental allergies.  Hematological: Bruises/bleeds easily.  All other systems reviewed and are negative.       Objective:   Physical Exam Vascular status is intact with pedal pulses palpable DP postal for PT one over 4 bilateral capillary refill time 3 seconds all digits skin temperature warm turgor normal no edema rubor pallor or varicosities noted there is darkening or ecchymosis of the right hallux nail plate consistent with subungual hematoma which occurred approximately is much is December to share 3-4 months ago. Agent reinjured again within the last couple of weeks there was some irritation redness pain tenderness discomfort there is a crack in the proximal nail plate with subungual hematoma in lack and right hallux nail being noted. Remaining nails unremarkable. Capillary refill time 3 seconds all digits neurologically epicritic and proprioceptive sensations intact and symmetric bilateral normal plantar response DTRs not elicited dermatologically skin color pigment normal hair growth absent nails somewhat criptotic orthopedic exam rectus foot type otherwise unremarkable findings       Assessment & Plan:  Assessment this time is contusion hallux nail plate with subungual hematoma and subsequent ingrowing nail the new nail CT ingrowing beneath the old novels nail there is edema erythema no sign of malodor no apparent discharge drainage no ascending psoas lymphangitis noted at this time  recommendation is for nail avulsion removal of the damaged nail plate and hematoma along new nail to grow normally. May be K. for partial nail excision the future if he continues have keratosis incurvation of nails. At this time local anesthetic block Mr. to 3 cc 50-50 mixture percent Xylocaine plain and 0.5 Marcaine plain to the right great toe the nail plate is avulsed the new nail growing underneath is also avulsed as it was irregular in shape of the nailbed appears to have some indentation although no break in the skin no open wound ascending psoas Venters no apparent discharge or drainage noted at this time the nail plate is cleansed with all cleansed Surgicel topical hemostatic agent was applied Betadine ointment and a gauze dressing applied to the hallux patient is instructed soaking daily Betadine warm water or Epsom salts in warm water also recommended Tylenol C. for pain afterwards dressed with Neosporin and Band-Aid dressing daily recheck in 2-3 weeks for followup for nail check and Tylenol for pain maintain appropriate dressing and shoes patient is a gout joints the [place] advised she may get his foot in saltwater with the next couple days once the initial healing is occurred within like 48 hours. Followup in 3 weeks for nail check  Harriet Masson DPM

## 2014-01-21 ENCOUNTER — Telehealth: Payer: Self-pay | Admitting: Interventional Cardiology

## 2014-01-21 NOTE — Telephone Encounter (Signed)
It appears he is also taking Celebrex which could contribute to the bruising.  He could try and take tylenol instead of celebrex for 1-2 weeks to see if this improves things.  Otherwise, he may want to discuss things in 01/2014 with Dr. Irish Lack to see if he needs to continue on combination of aspirin + plavix.

## 2014-01-21 NOTE — Telephone Encounter (Signed)
Spoke with pt and he has more bruising lately than he normally does. Pt states the bruises do go away after 7-10 day. 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 Pt is taking plavix 75 mg and aspirin 81 mg. Pt did take some aleve 3 weeks ago but thinks it was only 2 pills. Pt doesn't take ibuprofen. Pt thinks he has been on plavix about 7 years.

## 2014-01-21 NOTE — Telephone Encounter (Signed)
Spoke with pt and he hasnt taken celebrex in since last August. Pt wants to know if he still needs to be on the plavix and the aspirin?

## 2014-01-21 NOTE — Telephone Encounter (Signed)
New problem    Pt has questions about his medications.

## 2014-01-26 NOTE — Telephone Encounter (Signed)
Ok to stop aspirin and continue plavix.  See if bruising improves

## 2014-01-27 NOTE — Telephone Encounter (Signed)
Pt notified. Meds updated. Pt will call back if bruising doesn't get better.

## 2014-01-30 ENCOUNTER — Ambulatory Visit: Payer: 59 | Admitting: Interventional Cardiology

## 2014-02-28 ENCOUNTER — Ambulatory Visit: Payer: 59 | Admitting: Interventional Cardiology

## 2014-03-13 DIAGNOSIS — H43829 Vitreomacular adhesion, unspecified eye: Secondary | ICD-10-CM | POA: Diagnosis not present

## 2014-03-13 DIAGNOSIS — H35349 Macular cyst, hole, or pseudohole, unspecified eye: Secondary | ICD-10-CM | POA: Diagnosis not present

## 2014-03-13 DIAGNOSIS — H2510 Age-related nuclear cataract, unspecified eye: Secondary | ICD-10-CM | POA: Diagnosis not present

## 2014-04-24 ENCOUNTER — Encounter: Payer: Self-pay | Admitting: Cardiology

## 2014-04-24 DIAGNOSIS — E785 Hyperlipidemia, unspecified: Secondary | ICD-10-CM

## 2014-04-24 DIAGNOSIS — I10 Essential (primary) hypertension: Secondary | ICD-10-CM

## 2014-04-24 DIAGNOSIS — I2510 Atherosclerotic heart disease of native coronary artery without angina pectoris: Secondary | ICD-10-CM | POA: Insufficient documentation

## 2014-05-01 ENCOUNTER — Encounter: Payer: Self-pay | Admitting: Interventional Cardiology

## 2014-05-01 ENCOUNTER — Ambulatory Visit (INDEPENDENT_AMBULATORY_CARE_PROVIDER_SITE_OTHER): Payer: 59 | Admitting: Interventional Cardiology

## 2014-05-01 VITALS — BP 140/90 | HR 63 | Ht 70.5 in | Wt 210.0 lb

## 2014-05-01 DIAGNOSIS — I2510 Atherosclerotic heart disease of native coronary artery without angina pectoris: Secondary | ICD-10-CM

## 2014-05-01 DIAGNOSIS — I10 Essential (primary) hypertension: Secondary | ICD-10-CM

## 2014-05-01 DIAGNOSIS — E669 Obesity, unspecified: Secondary | ICD-10-CM

## 2014-05-01 DIAGNOSIS — E785 Hyperlipidemia, unspecified: Secondary | ICD-10-CM

## 2014-05-01 NOTE — Progress Notes (Signed)
Patient ID: Arthur Shaw, male   DOB: [birth year], 68 y.o.   MRN: [id number]    [place] [place], [place]  [address] Phone: [phone] Fax:  [phone]  Date:  05/01/2014   ID:  Arthur Shaw, DOB 03-Nov-1945, MRN [id number]  PCP:  Mathews Argyle, MD      History of Present Illness: Arthur Shaw is a 68 y.o. male who has had CAD, with DE stent to LAD in 2006. He has high cholesterol. He has been intolerant of several statins including Zocor, Crestor, Lipitor due to muscle pains. He continues to have muscle pains intermittently, but they are improved off of statins. He feels muscle fatigue on the statins.  Prior to his stent, he had woken up with chest pressure, SOB and diaphoresis and went to the hospital. Hen[place] had an MI.  He had  weight loss surgery (sleeve).   No chest pain.  No SHOB.  Occasional indigestion related only to eating.  He walks daily, 2 miles.  Not been on the machines at this point but is getting back to that.    Wt Readings from Last 3 Encounters:  05/01/14 210 lb (95.255 kg)     Past Medical History  Diagnosis Date  . Essential hypertension, benign   . Hyperlipidemia   . ASCVD (arteriosclerotic cardiovascular disease)     single vessel, s/p DE stent to mid LAD 4/06  . Statin intolerance   . Gout   . Obesity   . Fibromyalgia     questionable  . GERD (gastroesophageal reflux disease)     UGI 09/2012  . Adenomatous colon polyp     4/06 FS--[facility]; 9/07 colon: solit sm aden p  . Prediabetes   . Macular hole     at [facility]  . Former smoker, stopped smoking in distant past     Current Outpatient Prescriptions  Medication Sig Dispense Refill  . azelastine (ASTELIN) 137 MCG/SPRAY nasal spray Place 1 spray into the nose 2 (two) times daily. Use in each nostril as directed      . B-12, Methylcobalamin, 1000 MCG SUBL Place 1,000 mcg under the tongue daily.      . benazepril (LOTENSIN) 20 MG tablet Take 20 mg by mouth  daily.      . calcium citrate (CALCITRATE - DOSED IN MG ELEMENTAL CALCIUM) 950 MG tablet Take 500 mg of elemental calcium by mouth daily.      . cetirizine (ZYRTEC) 10 MG tablet Take 10 mg by mouth daily.      . Cholecalciferol (VITAMIN D-3 PO) Take by mouth.      . clopidogrel (PLAVIX) 75 MG tablet Take 75 mg by mouth daily.      . cyclobenzaprine (FLEXERIL) 10 MG tablet Take 10 mg by mouth 3 (three) times daily as needed.      . eszopiclone (LUNESTA) 2 MG TABS Take 2 mg by mouth at bedtime. Take immediately before bedtime      . ezetimibe (ZETIA) 10 MG tablet Take 10 mg by mouth daily.      . IRON, FERROUS GLUCONATE, PO Take 120 mg by mouth daily.      . Metaxalone (SKELAXIN PO) Take 20 mg by mouth daily.      . metoprolol (LOPRESSOR) 50 MG tablet Take 50 mg by mouth 2 (two) times daily.      . Multiple Vitamin (MULTIVITAMIN) tablet Take 1 tablet by mouth daily.      Marland Kitchen  nitroGLYCERIN (NITROSTAT) 0.4 MG SL tablet Place 0.4 mg under the tongue every 5 (five) minutes as needed for chest pain.      . pantoprazole (PROTONIX) 40 MG tablet Take 40 mg by mouth daily.      . polyethylene glycol (MIRALAX / GLYCOLAX) packet Take 17 g by mouth daily.      . rosuvastatin (CRESTOR) 10 MG tablet Take 10 mg by mouth once a week.       . Triamcinolone Acetonide (NASACORT AQ NA) Place into the nose.      . celecoxib (CELEBREX) 200 MG capsule Take 200 mg by mouth 2 (two) times daily.      . cyanocobalamin 1000 MCG tablet Take 100 mcg by mouth daily.      . ferrous sulfate 325 (65 FE) MG tablet Take 120 mg by mouth daily with breakfast.      . furosemide (LASIX) 20 MG tablet Take 20 mg by mouth 2 (two) times daily.      . magnesium 30 MG tablet Take 250 mg by mouth 2 (two) times daily.      . metaxalone (SKELAXIN) 800 MG tablet Take 800 mg by mouth 3 (three) times daily.      . sucralfate (CARAFATE) 1 GM/10ML suspension Take 10 mLs (1 g total) by mouth 4 (four) times daily.  420 mL  0  . triamcinolone  (NASACORT) 55 MCG/ACT nasal inhaler Place 2 sprays into the nose daily.       No current facility-administered medications for this visit.    Allergies:    Allergies  Allergen Reactions  . Statins     fatigue  . Welchol [Colesevelam Hcl]     Muscle aches    Social History:  The patient  reports that he has never smoked. He has never used smokeless tobacco. He reports that he does not drink alcohol or use illicit drugs.   Family History:  The patient's family history includes Arrhythmia in his father; CAD in his brother; Dementia in his mother; Fibromyalgia in his sister; Heart failure in his father; Hyperlipidemia in his brother.   ROS:  Please see the history of present illness.  No nausea, vomiting.  No fevers, chills.  No focal weakness.  No dysuria.   All other systems reviewed and negative.   PHYSICAL EXAM: VS:  BP 140/90  Pulse 63  Ht 5' 10.5" (1.791 m)  Wt 210 lb (95.255 kg)  BMI 29.70 kg/m2 Well nourished, well developed, in no acute distress HEENT: normal Neck: no JVD, no carotid bruits Cardiac:  normal S1, S2; RRR;  Lungs:  clear to auscultation bilaterally, no wheezing, rhonchi or rales Abd: soft, nontender, no hepatomegaly Ext: no edema Skin: warm and dry Neuro:   no focal abnormalities noted  EKG:  Normal   ASSESSMENT AND PLAN:  CAD in native artery  Notes: Negative stress test in 2013. Back pain was different than prior CAD sx. He will let us know if he has any angina.  2. Essential hypertension, benign  Continue Amlodipine Besy-Benazepril HCl Capsule, 5-20 MG, TAKE 1 CAPSULE BY MOUTH ONCE A DAY Continue Metoprolol Tartrate Tablet, 50 MG, TAKE 1 TABLET BY MOUTH TWICE A DAY Notes: Continue lifestyle changes. Otherwise, will add meds. Follow at home also. Controlled. Forgot to take meds this morning. 3. Hypercholesteremia  Continue Crestor Tablet, 10 MG, 1 tablet, Orally, Once a week Continue Zetia Tablet, 10 MG, TAKE 1 TABLET ONCE A DAY Notes: LDL 60 in  the  past. LDL 78 in 2015 at [facility] scanned. 4. Obesity, unspecified  Notes: Continue efforts at weight loss.  He has done very well post weight loss surgery.  Recommended regular exercise.   Signed, Mina Marble, MD, [facility] 05/01/2014 4:55 PM

## 2014-05-01 NOTE — Patient Instructions (Signed)
Your physician recommends that you continue on your current medications as directed. Please refer to the Current Medication list given to you today.  Your physician wants you to follow-up in: 1 year with Dr. Varanasi. You will receive a reminder letter in the mail two months in advance. If you don't receive a letter, please call our office to schedule the follow-up appointment.  

## 2014-05-02 ENCOUNTER — Other Ambulatory Visit: Payer: Self-pay

## 2014-05-02 DIAGNOSIS — E669 Obesity, unspecified: Secondary | ICD-10-CM | POA: Insufficient documentation

## 2014-05-02 MED ORDER — NITROGLYCERIN 0.4 MG SL SUBL: 0.4000 mg | SUBLINGUAL_TABLET | SUBLINGUAL | Status: DC | PRN

## 2014-05-05 ENCOUNTER — Ambulatory Visit: Payer: Self-pay | Admitting: Interventional Cardiology

## 2014-05-12 ENCOUNTER — Other Ambulatory Visit: Payer: Self-pay

## 2014-05-12 MED ORDER — ROSUVASTATIN CALCIUM 10 MG PO TABS: 10.0000 mg | ORAL_TABLET | ORAL | Status: DC

## 2014-09-05 DIAGNOSIS — E669 Obesity, unspecified: Secondary | ICD-10-CM | POA: Diagnosis not present

## 2014-09-05 DIAGNOSIS — Z903 Acquired absence of stomach [part of]: Secondary | ICD-10-CM | POA: Diagnosis not present

## 2014-10-28 DIAGNOSIS — E780 Pure hypercholesterolemia: Secondary | ICD-10-CM | POA: Diagnosis not present

## 2014-10-28 DIAGNOSIS — I10 Essential (primary) hypertension: Secondary | ICD-10-CM | POA: Diagnosis not present

## 2014-10-28 DIAGNOSIS — E669 Obesity, unspecified: Secondary | ICD-10-CM | POA: Diagnosis not present

## 2014-10-28 DIAGNOSIS — Z6830 Body mass index (BMI) 30.0-30.9, adult: Secondary | ICD-10-CM | POA: Diagnosis not present

## 2014-10-28 DIAGNOSIS — Z23 Encounter for immunization: Secondary | ICD-10-CM | POA: Diagnosis not present

## 2014-10-28 DIAGNOSIS — R7309 Other abnormal glucose: Secondary | ICD-10-CM | POA: Diagnosis not present

## 2014-10-28 DIAGNOSIS — I2510 Atherosclerotic heart disease of native coronary artery without angina pectoris: Secondary | ICD-10-CM | POA: Diagnosis not present

## 2014-10-28 DIAGNOSIS — Z79899 Other long term (current) drug therapy: Secondary | ICD-10-CM | POA: Diagnosis not present

## 2014-11-05 DIAGNOSIS — H6691 Otitis media, unspecified, right ear: Secondary | ICD-10-CM | POA: Diagnosis not present

## 2014-12-02 ENCOUNTER — Other Ambulatory Visit: Payer: Self-pay | Admitting: Interventional Cardiology

## 2015-01-02 DIAGNOSIS — J309 Allergic rhinitis, unspecified: Secondary | ICD-10-CM | POA: Diagnosis not present

## 2015-01-29 ENCOUNTER — Other Ambulatory Visit: Payer: Self-pay | Admitting: Interventional Cardiology

## 2015-02-06 ENCOUNTER — Encounter: Payer: Self-pay | Admitting: Nurse Practitioner

## 2015-02-06 ENCOUNTER — Ambulatory Visit (INDEPENDENT_AMBULATORY_CARE_PROVIDER_SITE_OTHER): Payer: 59 | Admitting: Nurse Practitioner

## 2015-02-06 VITALS — BP 140/90 | HR 60 | Temp 98.2°F | Ht 70.0 in | Wt 232.0 lb

## 2015-02-06 DIAGNOSIS — IMO0001 Reserved for inherently not codable concepts without codable children: Secondary | ICD-10-CM

## 2015-02-06 DIAGNOSIS — F439 Reaction to severe stress, unspecified: Secondary | ICD-10-CM | POA: Diagnosis not present

## 2015-02-06 DIAGNOSIS — R030 Elevated blood-pressure reading, without diagnosis of hypertension: Secondary | ICD-10-CM

## 2015-02-06 DIAGNOSIS — F430 Acute stress reaction: Secondary | ICD-10-CM

## 2015-02-06 NOTE — Progress Notes (Signed)
Pre visit review using our clinic review tool, if applicable. No additional management support is needed unless otherwise documented below in the visit note. 

## 2015-02-06 NOTE — Progress Notes (Signed)
Subjective:     Arthur Shaw is a 69 y.o. male and is here to discuss situational stress. He has primary care provider. BP was elevated on exam. He is taking metoprolol & benazepril.  Mr Arthur Shaw expresses distress over his grown son. He suspects his son has a drug addiction, also has multiple chronic pain conditions, is a Airline pilot, uses growth hormones/steroids, was recently charged w/DUI. He is concerned that his son is "going to get hurt or hurt someone". He expresses marital strain contributed to by son's behaviors. Mr Arthur Shaw is frustrated with "healthcare system": he feels his son is getting fragmented care & wonders if I will "take his son" as a patient. His son has a primary care provider.  I spent several minutes discussing patterns of behavior of addiction to help Mr Arthur Shaw gain some understanding regarding his son's reported behaviors. I recommend he discuss concerns with son & reach out to his son's primary care provider (with son's permission) to seek clarification regarding w/u for pain syndromes.  Recommend Mr Arthur Shaw visit his PCP regarding elevated BP.  History   Social History  . Marital Status: Married    Spouse Name: N/A  . Number of Children: 2  . Years of Education: N/A   Occupational History  . deputy [employer] History Main Topics  . Smoking status: Never Smoker   . Smokeless tobacco: Never Used  . Alcohol Use: No  . Drug Use: No  . Sexual Activity: Not on file   Other Topics Concern  . 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 He lives with his wife & son.   Health Maintenance  Topic Date Due  . TETANUS/TDAP  12/12/1966  . COLONOSCOPY  12/11/1997  . ZOSTAVAX  12/12/2007  . PNA vac Low Risk Adult (1 of 2 - PCV13) 12/11/2012  . INFLUENZA VACCINE  05/04/2015    The following portions of the patient's history were reviewed and updated as appropriate: allergies, current medications, past family  history, past medical history, past social history, past surgical history and problem list.  Review of Systems Pertinent items are noted in HPI.   Objective:    BP 140/90 mmHg  Pulse 60  Temp(Src) 98.2 F (36.8 C) (Oral)  Ht 5\' 10"  (1.778 m)  Wt 232 lb (105.235 kg)  BMI 33.29 kg/m2  SpO2 92% General appearance: alert, cooperative, appears stated age and mild distress Head: Normocephalic, without obvious abnormality, atraumatic Eyes: negative findings: lids and lashes normal and conjunctivae and sclerae normal Neurologic: Grossly normal    Assessment:Plan  1. Elevated blood pressure F/u w/PCP  2. Stress disorder, acute Discuss concerns w/ son information gathering w/son & his PCP  Spent 20 Minutes with patient, 50% or more was spent in counseling.

## 2015-03-09 ENCOUNTER — Telehealth: Payer: Self-pay | Admitting: Interventional Cardiology

## 2015-03-09 ENCOUNTER — Ambulatory Visit (INDEPENDENT_AMBULATORY_CARE_PROVIDER_SITE_OTHER): Payer: 59 | Admitting: Interventional Cardiology

## 2015-03-09 ENCOUNTER — Encounter: Payer: Self-pay | Admitting: Interventional Cardiology

## 2015-03-09 VITALS — BP 160/70 | HR 70 | Ht 70.0 in | Wt 229.4 lb

## 2015-03-09 DIAGNOSIS — E669 Obesity, unspecified: Secondary | ICD-10-CM | POA: Diagnosis not present

## 2015-03-09 DIAGNOSIS — E785 Hyperlipidemia, unspecified: Secondary | ICD-10-CM | POA: Diagnosis not present

## 2015-03-09 DIAGNOSIS — I2510 Atherosclerotic heart disease of native coronary artery without angina pectoris: Secondary | ICD-10-CM | POA: Diagnosis not present

## 2015-03-09 NOTE — Progress Notes (Signed)
Patient ID: Arthur Shaw, male   DOB: 01/20/1946, 69 y.o.   MRN: [id number]     Cardiology Office Note   Date:  03/09/2015   ID:  Arthur Shaw, DOB November 06, 1945, MRN [id number]  PCP:  Irene Pap, NP    No chief complaint on file.    Wt Readings from Last 3 Encounters:  03/09/15 229 lb 6.4 oz (104.055 kg)  02/06/15 232 lb (105.235 kg)  05/01/14 210 lb (95.255 kg)       History of Present Illness: Arthur Shaw is a 69 y.o. male  who had a 2.75 x 16 Taxus stent to his mid LAD in 2006. Balloon angioplasty was performed of the jailed diagonal. Repeat cardiac cath in 2008 showed a patent stent.  He has had issues being overweight. He had a negative stress test in 2013. In 2014, he underwent gastric sleeve surgery. He has lost about 75 pounds since that time.  He has stayed on clopidogrel since he received a first generation drug-eluting stent. He denies any bleeding problems.  Overall, he had been doing well. He was mowing the lawn over the weekend and felt some discomfort in his jaw. He continued tomorrow and the pain intensified in his jaw. His prior cardiac discomfort was in the center of his chest. The sensation he had while mowing the lawn was a different feeling but since it seemed to get worse as he did more mowing, he came in for evaluation. He is also concerned because he is going to [facility]  for a conference next week.    Past Medical History  Diagnosis Date  . Essential hypertension, benign   . Hyperlipidemia   . ASCVD (arteriosclerotic cardiovascular disease)     single vessel, s/p DE stent to mid LAD 4/06  . Statin intolerance   . Gout   . Obesity   . Fibromyalgia     questionable  . GERD (gastroesophageal reflux disease)     UGI 09/2012  . Adenomatous colon polyp     4/06 FS--[facility]; 9/07 colon: solit sm aden p  . Prediabetes   . Macular hole     at [facility]  . Former smoker, stopped smoking in distant past     Past Surgical History  Procedure  Laterality Date  . Coronary stent placement      taxus stent placed-Dr. Ilda Foil [facility] 01/2005  . Tonsillectomy    . Stomach surgery      bariatric surgery 05/19/13     Current Outpatient Prescriptions  Medication Sig Dispense Refill  . B-12, Methylcobalamin, 1000 MCG SUBL Place 1,000 mcg under the tongue daily.    . benazepril (LOTENSIN) 20 MG tablet Take 20 mg by mouth daily.    . calcium citrate (CALCITRATE - DOSED IN MG ELEMENTAL CALCIUM) 950 MG tablet Take 500 mg of elemental calcium by mouth daily.    . Cholecalciferol (VITAMIN D-3 PO) Take by mouth.    . clopidogrel (PLAVIX) 75 MG tablet Take 75 mg by mouth daily.    . CRESTOR 10 MG tablet TAKE 1 TABLET BY MOUTH EVERY DAY 30 tablet 3  . cyclobenzaprine (FLEXERIL) 10 MG tablet Take 10 mg by mouth 3 (three) times daily as needed.    . eszopiclone (LUNESTA) 2 MG TABS Take 2 mg by mouth at bedtime. Take immediately before bedtime    . ezetimibe (ZETIA) 10 MG tablet Take 10 mg by mouth daily.    . IRON, FERROUS GLUCONATE, PO Take  120 mg by mouth daily.    . metoprolol (LOPRESSOR) 50 MG tablet Take 50 mg by mouth 2 (two) times daily.    . Multiple Vitamin (MULTIVITAMIN) tablet Take 1 tablet by mouth daily.    . nitroGLYCERIN (NITROSTAT) 0.4 MG SL tablet Place 1 tablet (0.4 mg total) under the tongue every 5 (five) minutes as needed for chest pain. 25 tablet 3  . pantoprazole (PROTONIX) 40 MG tablet Take 40 mg by mouth daily.    . polyethylene glycol (MIRALAX / GLYCOLAX) packet Take 17 g by mouth daily.    . Triamcinolone Acetonide (NASACORT AQ NA) Place into the nose.    Marland Kitchen ZETONNA 37 MCG/ACT AERS Place 2 sprays into both nostrils.  5  . sucralfate (CARAFATE) 1 GM/10ML suspension Take 10 mLs (1 g total) by mouth 4 (four) times daily. 420 mL 0   No current facility-administered medications for this visit.    Allergies:   Statins and Welchol    Social History:  The patient  reports that he has never smoked. He has never used smokeless  tobacco. He reports that he does not drink alcohol or use illicit drugs.   Family History:  The patient's family history includes Arrhythmia in his father; CAD in his brother; Dementia in his mother; Drug abuse in his son; Fibromyalgia in his sister; Heart failure in his father; Hyperlipidemia in his brother.    ROS:  Please see the history of present illness.   Otherwise, review of systems are positive for jaw pain.   All other systems are reviewed and negative.    PHYSICAL EXAM: VS:  BP 160/70 mmHg  Pulse 70  Ht 5\' 10"  (1.778 m)  Wt 229 lb 6.4 oz (104.055 kg)  BMI 32.92 kg/m2 , BMI Body mass index is 32.92 kg/(m^2). GEN: Well nourished, well developed, in no acute distress HEENT: normal Neck: no JVD, carotid bruits, or masses Cardiac: RRR; no murmurs, rubs, or gallops,no edema  Respiratory:  clear to auscultation bilaterally, normal work of breathing GI: soft, nontender, nondistended, + BS MS: no deformity or atrophy Skin: warm and dry, no rash Neuro:  Strength and sensation are intact Psych: euthymic mood, full affect   EKG:   The ekg ordered today demonstrates normal, no ST segment changes   Recent Labs: No results found for requested labs within last 365 days.   Lipid Panel No results found for: CHOL, TRIG, HDL, CHOLHDL, VLDL, LDLCALC, LDLDIRECT   Other studies Reviewed: Additional studies/ records that were reviewed today with results demonstrating: Cath reports reviewed as above.   ASSESSMENT AND PLAN:  1. CAD: Unclear whether his jaw discomfort is actually angina. He has done other activities without getting this jaw pain. We discussed nuclear stress testing versus cardiac cath. We are in agreement that stress testing is more reasonable given the frequency of his symptoms. We'll plan for this sometime this week. If it is abnormal -low to intermediate risk, I would consider adding isosorbide in addition to his beta blocker which she is RED taking. If he continues to  have symptoms beyond that, we'll plan for coronary angiography.   If it is high risk, he will need coronary angiography more urgently. 2. Obesity: He still has some weight to lose. We talked about the importance of trying to decrease his waist size. He is already gone from 48 down to 21. I asked him to try to shoot for a 34 waist size. 3. Hyperlipidemia: Continue Crestor and Zetia.  Current medicines are reviewed at length with the patient today.  The patient concerns regarding his medicines were addressed.  The following changes have been made:  No change  Labs/ tests ordered today include: Stress test   Orders Placed This Encounter  Procedures  . Myocardial Perfusion Imaging  . EKG 12-Lead    Recommend 150 minutes/week of aerobic exercise Low fat, low carb, high fiber diet recommended  Disposition:   FU as scheduled, based on Cardiolite results.   Teresita Madura., MD  03/09/2015 6:28 PM    [facility] [address], [place], [place]  [address] Phone: [phone]; Fax: [phone]

## 2015-03-09 NOTE — Telephone Encounter (Signed)
Left message to call back  

## 2015-03-09 NOTE — Patient Instructions (Signed)
Medication Instructions:  Your physician recommends that you continue on your current medications as directed. Please refer to the Current Medication list given to you today.   Labwork: None    Testing/Procedures: Your physician has requested that you have en exercise stress myoview. For further information please visit [email or web address]. Please follow instruction sheet, as given.   Follow-Up: Your physician recommends that you schedule a follow-up appointment pending results   Any Other Special Instructions Will Be Listed Below (If Applicable).

## 2015-03-09 NOTE — Telephone Encounter (Signed)
Spoke with pt. He reports bilateral jaw pain over weekend while moving grass.  Went away when he stopped mowing.  Has sinus issues but this pain is different than he had in past with sinus problems.  Occasional "pings" of pain since but last only briefly.  He did not have jaw pain when stent put in several years ago.  Occasional chest pain that will sometimes improve with belching. History of stomach bypass surgery. Reports feeling more fatigued lately. No shortness of breath. He is going out of town next week and is concerned about these symptoms.  Appt made for pt to see Dr. Beau Fanny today at 4:15

## 2015-03-09 NOTE — Telephone Encounter (Signed)
Pt had jaw pain last week while mowing grass-stopped as soon as he sat down-pt worried-pls advise-wants appt

## 2015-03-10 ENCOUNTER — Telehealth (HOSPITAL_COMMUNITY): Payer: Self-pay | Admitting: *Deleted

## 2015-03-10 NOTE — Telephone Encounter (Signed)
Patient given detailed instructions per Myocardial Perfusion Study Information Sheet for test on 03/12/15  at 0715. Patient verbalized understanding. Voris Tigert, Ranae Palms

## 2015-03-12 ENCOUNTER — Ambulatory Visit (HOSPITAL_COMMUNITY): Payer: 59 | Attending: Internal Medicine

## 2015-03-12 DIAGNOSIS — I2510 Atherosclerotic heart disease of native coronary artery without angina pectoris: Secondary | ICD-10-CM | POA: Diagnosis not present

## 2015-03-12 DIAGNOSIS — I10 Essential (primary) hypertension: Secondary | ICD-10-CM | POA: Diagnosis not present

## 2015-03-12 LAB — MYOCARDIAL PERFUSION IMAGING
CHL CUP NUCLEAR SDS: 1
CHL CUP NUCLEAR SSS: 4
CSEPEDS: 0 s
CSEPHR: 90 %
CSEPPHR: 139 {beats}/min
Estimated workload: 7 METS
Exercise duration (min): 6 min
LHR: 0.31
LV dias vol: 96 mL
LVSYSVOL: 38 mL
MPHR: 153 {beats}/min
NUC STRESS TID: 0.92
Nuc Stress EF: 61 %
Rest HR: 76 {beats}/min
SRS: 3

## 2015-03-12 MED ORDER — TECHNETIUM TC 99M SESTAMIBI GENERIC - CARDIOLITE
33.0000 | Freq: Once | INTRAVENOUS | Status: AC | PRN
  Administered 2015-03-12: 33 via INTRAVENOUS

## 2015-03-12 MED ORDER — TECHNETIUM TC 99M SESTAMIBI GENERIC - CARDIOLITE
11.0000 | Freq: Once | INTRAVENOUS | Status: AC | PRN
  Administered 2015-03-12: 11 via INTRAVENOUS

## 2015-03-13 ENCOUNTER — Telehealth: Payer: Self-pay | Admitting: Interventional Cardiology

## 2015-03-13 NOTE — Telephone Encounter (Signed)
New Message    Pt is returning call from [facility] about treadmill results. Please call pt

## 2015-03-19 DIAGNOSIS — H35342 Macular cyst, hole, or pseudohole, left eye: Secondary | ICD-10-CM | POA: Diagnosis not present

## 2015-03-19 DIAGNOSIS — H43822 Vitreomacular adhesion, left eye: Secondary | ICD-10-CM | POA: Diagnosis not present

## 2015-03-19 DIAGNOSIS — H2513 Age-related nuclear cataract, bilateral: Secondary | ICD-10-CM | POA: Diagnosis not present

## 2015-03-20 ENCOUNTER — Ambulatory Visit (HOSPITAL_COMMUNITY): Payer: 59

## 2015-03-27 ENCOUNTER — Encounter (HOSPITAL_COMMUNITY): Payer: 59

## 2015-03-30 ENCOUNTER — Other Ambulatory Visit: Payer: Self-pay

## 2015-03-30 DIAGNOSIS — H2513 Age-related nuclear cataract, bilateral: Secondary | ICD-10-CM | POA: Diagnosis not present

## 2015-04-23 DIAGNOSIS — Z903 Acquired absence of stomach [part of]: Secondary | ICD-10-CM | POA: Diagnosis not present

## 2015-04-23 DIAGNOSIS — E669 Obesity, unspecified: Secondary | ICD-10-CM | POA: Diagnosis not present

## 2015-06-13 DIAGNOSIS — J309 Allergic rhinitis, unspecified: Principal | ICD-10-CM

## 2015-06-13 DIAGNOSIS — H1010 Acute atopic conjunctivitis, unspecified eye: Secondary | ICD-10-CM | POA: Insufficient documentation

## 2015-06-13 DIAGNOSIS — H6990 Unspecified Eustachian tube disorder, unspecified ear: Secondary | ICD-10-CM | POA: Insufficient documentation

## 2015-06-13 DIAGNOSIS — H6980 Other specified disorders of Eustachian tube, unspecified ear: Secondary | ICD-10-CM | POA: Insufficient documentation

## 2015-06-30 DIAGNOSIS — Z01818 Encounter for other preprocedural examination: Secondary | ICD-10-CM | POA: Diagnosis not present

## 2015-06-30 DIAGNOSIS — H2512 Age-related nuclear cataract, left eye: Secondary | ICD-10-CM | POA: Diagnosis not present

## 2015-07-07 ENCOUNTER — Ambulatory Visit: Payer: 59 | Admitting: Allergy and Immunology

## 2015-07-09 DIAGNOSIS — I10 Essential (primary) hypertension: Secondary | ICD-10-CM | POA: Diagnosis not present

## 2015-07-09 DIAGNOSIS — H25812 Combined forms of age-related cataract, left eye: Secondary | ICD-10-CM | POA: Diagnosis not present

## 2015-07-09 DIAGNOSIS — K219 Gastro-esophageal reflux disease without esophagitis: Secondary | ICD-10-CM | POA: Diagnosis not present

## 2015-07-09 DIAGNOSIS — E559 Vitamin D deficiency, unspecified: Secondary | ICD-10-CM | POA: Diagnosis not present

## 2015-07-09 DIAGNOSIS — Z87891 Personal history of nicotine dependence: Secondary | ICD-10-CM | POA: Diagnosis not present

## 2015-07-23 ENCOUNTER — Telehealth: Payer: Self-pay

## 2015-07-23 NOTE — Telephone Encounter (Signed)
Denied Zetonna refill.  Patient needs Office visit.

## 2015-08-04 ENCOUNTER — Ambulatory Visit (INDEPENDENT_AMBULATORY_CARE_PROVIDER_SITE_OTHER): Payer: 59 | Admitting: Allergy and Immunology

## 2015-08-04 ENCOUNTER — Other Ambulatory Visit: Payer: Self-pay

## 2015-08-04 ENCOUNTER — Encounter: Payer: Self-pay | Admitting: Allergy and Immunology

## 2015-08-04 VITALS — BP 150/100 | HR 56 | Resp 18

## 2015-08-04 DIAGNOSIS — I2510 Atherosclerotic heart disease of native coronary artery without angina pectoris: Secondary | ICD-10-CM

## 2015-08-04 DIAGNOSIS — J309 Allergic rhinitis, unspecified: Secondary | ICD-10-CM | POA: Diagnosis not present

## 2015-08-04 DIAGNOSIS — Z23 Encounter for immunization: Secondary | ICD-10-CM

## 2015-08-04 DIAGNOSIS — H1010 Acute atopic conjunctivitis, unspecified eye: Secondary | ICD-10-CM | POA: Diagnosis not present

## 2015-08-04 MED ORDER — CICLESONIDE 37 MCG/ACT NA AERS: INHALATION_SPRAY | NASAL | Status: DC

## 2015-08-04 MED ORDER — MONTELUKAST SODIUM 10 MG PO TABS: 10.0000 mg | ORAL_TABLET | Freq: Every day | ORAL | Status: DC

## 2015-08-04 NOTE — Progress Notes (Signed)
[facility]  Follow-up Note  Refering Provider: Lajean Manes, MD Primary Provider: Mathews Argyle, MD  Subjective:   Arthur Shaw is a 69 y.o. male who returns to the [facility] in re-evaluation of the following:  HPI Comments:  Arthur Shaw returns in reevaluation of his allergic rhinoconjunctivitis. He has done quite well and has not had any significant problems until he ran out of zetonna. When he ran out of the zetonna recently he developed significant nasal congestion is gone back to using Afrin.. For the most part while using a nasal steroid and his montelukast he thought that he was under very good control. He had no need to use any Patanase.   Outpatient Encounter Prescriptions as of 08/04/2015  Medication Sig  . benazepril (LOTENSIN) 20 MG tablet Take 20 mg by mouth daily.  . calcium citrate (CALCITRATE - DOSED IN MG ELEMENTAL CALCIUM) 950 MG tablet Take 500 mg of elemental calcium by mouth daily.  . Ciclesonide (ZETONNA) 37 MCG/ACT AERS 2 SPRAYS IN EACH NOTSRIL ONCE DAILY FOR STUFFY NOSE OR DRAINAGE  . clopidogrel (PLAVIX) 75 MG tablet Take 75 mg by mouth daily.  . cyclobenzaprine (FLEXERIL) 10 MG tablet Take 10 mg by mouth 3 (three) times daily as needed.  . eszopiclone (LUNESTA) 2 MG TABS Take 2 mg by mouth at bedtime. Take immediately before bedtime  . ezetimibe (ZETIA) 10 MG tablet Take 10 mg by mouth daily.  . metoprolol (LOPRESSOR) 50 MG tablet Take 50 mg by mouth 2 (two) times daily.  . montelukast (SINGULAIR) 10 MG tablet Take 10 mg by mouth at bedtime.  . Multiple Vitamin (MULTIVITAMIN) tablet Take 1 tablet by mouth daily.  . nitroGLYCERIN (NITROSTAT) 0.4 MG SL tablet Place 1 tablet (0.4 mg total) under the tongue every 5 (five) minutes as needed for chest pain.  . pantoprazole (PROTONIX) 40 MG tablet Take 40 mg by mouth daily.  Marland Kitchen ZETONNA 37 MCG/ACT AERS Place 2 sprays into both nostrils.  .  [DISCONTINUED] clopidogrel (PLAVIX) 75 MG tablet 75 mg.  . [DISCONTINUED] CRESTOR 10 MG tablet TAKE 1 TABLET BY MOUTH EVERY DAY  . acetaminophen-codeine (TYLENOL #3) 300-30 MG tablet TAKE 1 TABLET BY MOUTH EVERY 4 TO 6 HOURS AS NEEDED FOR PAIN NOT RELIEVED BY IBUPROFEN  . amLODipine-benazepril (LOTREL) 10-20 MG capsule 1 capsule.  . B-12, Methylcobalamin, 1000 MCG SUBL Place 1,000 mcg under the tongue daily.  . Cholecalciferol (VITAMIN D-3 PO) Take by mouth.  . cyclobenzaprine (FLEXERIL) 10 MG tablet 10 mg.  . docusate sodium (STOOL SOFTENER) 100 MG capsule 200 mg.  . eszopiclone (LUNESTA) 2 MG TABS tablet 1 mg.  . ezetimibe (ZETIA) 10 MG tablet 10 mg.  . IRON, FERROUS GLUCONATE, PO Take 120 mg by mouth daily.  Marland Kitchen ketorolac (ACULAR) 0.4 % SOLN 1 drop.  Marland Kitchen ketorolac (ACULAR) 0.5 % ophthalmic solution PLACE 1 DROP INTO THE LEFT EYE 4 TIMES DAILY FOR 10 DAYS.  Marland Kitchen metoprolol (LOPRESSOR) 50 MG tablet 50 mg.  . nitroGLYCERIN (NITROSTAT) 0.4 MG SL tablet Place 0.4 mg under the tongue.  Marland Kitchen Olopatadine HCl 0.6 % SOLN USE 2 SPRAYS IN EACH NOSTRIL TWICE A DAY AS NEEDED FOR RUNNY NOSE OR DRAINAGE  . polyethylene glycol (MIRALAX / GLYCOLAX) packet Take 17 g by mouth daily.  . prednisoLONE acetate (PRED FORTE) 1 % ophthalmic suspension 1 drop.  . rosuvastatin (CRESTOR) 10 MG tablet 10 mg.  . sildenafil (VIAGRA) 50 MG  tablet Take 50 mg by mouth.  . sucralfate (CARAFATE) 1 GM/10ML suspension Take 10 mLs (1 g total) by mouth 4 (four) times daily.  . Triamcinolone Acetonide (NASACORT AQ NA) Place into the nose.  . vitamin B-12 (CYANOCOBALAMIN) 1000 MCG tablet 1,000 mcg.   No facility-administered encounter medications on file as of 08/04/2015.    No orders of the defined types were placed in this encounter.    Past Medical History  Diagnosis Date  . Essential hypertension, benign   . Hyperlipidemia   . ASCVD (arteriosclerotic cardiovascular disease)     single vessel, s/p DE stent to mid LAD 4/06  .  Statin intolerance   . Gout   . Obesity   . Fibromyalgia     questionable  . GERD (gastroesophageal reflux disease)     UGI 09/2012  . Adenomatous colon polyp     4/06 FS--[facility]; 9/07 colon: solit sm aden p  . Prediabetes   . Macular hole     at [facility]  . Former smoker, stopped smoking in distant past     Past Surgical History  Procedure Laterality Date  . Coronary stent placement      taxus stent placed-Dr. Ilda Foil [facility] 01/2005  . Tonsillectomy    . Stomach surgery      bariatric surgery 05/19/13    Allergies  Allergen Reactions  . Statins     fatigue  . Welchol [Colesevelam Hcl]     Muscle aches    Review of Systems  Constitutional: Negative.   HENT: Positive for congestion.   Eyes: Negative.   Respiratory: Negative.   Cardiovascular: Negative.   Gastrointestinal: Negative.   Musculoskeletal: Negative.      Objective:   Filed Vitals:   08/04/15 1303  BP: 150/100  Pulse: 56  Resp: 18          Physical Exam  Constitutional: He appears well-developed and well-nourished. No distress.  HENT:  Head: Normocephalic and atraumatic. Head is without right periorbital erythema and without left periorbital erythema.  Right Ear: Tympanic membrane, external ear and ear canal normal. No drainage or tenderness. No foreign bodies. Tympanic membrane is not injected, not scarred, not perforated, not erythematous, not retracted and not bulging. No middle ear effusion.  Left Ear: Tympanic membrane, external ear and ear canal normal. No drainage or tenderness. No foreign bodies. Tympanic membrane is not injected, not scarred, not perforated, not erythematous, not retracted and not bulging.  No middle ear effusion.  Nose: Mucosal edema present. No rhinorrhea, nose lacerations or sinus tenderness.  No foreign bodies.  Mouth/Throat: Oropharynx is clear and moist. No oropharyngeal exudate, posterior oropharyngeal edema, posterior oropharyngeal erythema or tonsillar abscesses.   Eyes: Lids are normal. Right eye exhibits no chemosis, no discharge and no exudate. No foreign body present in the right eye. Left eye exhibits no chemosis, no discharge and no exudate. No foreign body present in the left eye. Right conjunctiva is not injected. Left conjunctiva is not injected.  Neck: Neck supple. No tracheal tenderness present. No tracheal deviation and no edema present. No thyroid mass and no thyromegaly present.  Cardiovascular: Normal rate, regular rhythm, S1 normal and S2 normal.  Exam reveals no gallop.   No murmur heard. Pulmonary/Chest: No accessory muscle usage or stridor. No respiratory distress. He has no wheezes. He has no rhonchi. He has no rales.  Abdominal: Soft. There is no hepatosplenomegaly. There is no tenderness. There is no rigidity, no rebound and no guarding.  Lymphadenopathy:  Head (right side): No tonsillar adenopathy present.       Head (left side): No tonsillar adenopathy present.    He has no cervical adenopathy.  Neurological: He is alert.  Skin: No rash noted. He is not diaphoretic.  Psychiatric: He has a normal mood and affect. His behavior is normal.    Diagnostics: None  Assessment and Plan:   1. Allergic rhinoconjunctivitis   2. Need for influenza vaccination      1. Continue montelukast 10mg  one time a day  2. Continue Zetonna one spray each nostril one time per day  3. No patanase  4. Flu vaccine today  6. Return in one year or earlier if problem.  I will assume that Juan's Arthur Shaw restarts his montelukast and zetonna he will do quite well and we'll stop the use of his over-the-counter nasal decongestant spray. We did give him the flu vaccine today. We'll see him back in this clinic in 1 year or earlier if there is a problem.   Allena Katz, MD [place]

## 2015-08-04 NOTE — Patient Instructions (Signed)
  1. Continue montelukast 10mg  one time a day  2. Continue Zetonna one spray each nostril one time per day  3. No patanase  4. Flu vaccine today  6. Return in one year or earlier if problem.

## 2015-10-23 DIAGNOSIS — E669 Obesity, unspecified: Secondary | ICD-10-CM | POA: Diagnosis not present

## 2015-10-23 DIAGNOSIS — Z6831 Body mass index (BMI) 31.0-31.9, adult: Secondary | ICD-10-CM | POA: Diagnosis not present

## 2015-10-23 DIAGNOSIS — Z903 Acquired absence of stomach [part of]: Secondary | ICD-10-CM | POA: Diagnosis not present

## 2015-12-11 DIAGNOSIS — H26492 Other secondary cataract, left eye: Secondary | ICD-10-CM | POA: Diagnosis not present

## 2015-12-20 ENCOUNTER — Other Ambulatory Visit: Payer: Self-pay | Admitting: Interventional Cardiology

## 2015-12-21 ENCOUNTER — Other Ambulatory Visit: Payer: Self-pay

## 2015-12-21 MED ORDER — OLOPATADINE HCL 0.6 % NA SOLN: NASAL | Status: DC

## 2015-12-25 NOTE — Telephone Encounter (Signed)
Late entry:  Notes Recorded by Loren Racer, LPN on 624THL at 624THL PM Pt returned call. Informed pt of results. Pt verbalized understanding.

## 2015-12-29 ENCOUNTER — Other Ambulatory Visit: Payer: Self-pay

## 2015-12-29 DIAGNOSIS — Z79899 Other long term (current) drug therapy: Secondary | ICD-10-CM | POA: Diagnosis not present

## 2015-12-29 DIAGNOSIS — E569 Vitamin deficiency, unspecified: Secondary | ICD-10-CM | POA: Diagnosis not present

## 2015-12-29 DIAGNOSIS — G4700 Insomnia, unspecified: Secondary | ICD-10-CM | POA: Diagnosis not present

## 2015-12-29 DIAGNOSIS — I10 Essential (primary) hypertension: Secondary | ICD-10-CM | POA: Diagnosis not present

## 2015-12-29 DIAGNOSIS — Z888 Allergy status to other drugs, medicaments and biological substances status: Secondary | ICD-10-CM | POA: Diagnosis not present

## 2015-12-29 DIAGNOSIS — N529 Male erectile dysfunction, unspecified: Secondary | ICD-10-CM | POA: Diagnosis not present

## 2015-12-29 DIAGNOSIS — H25811 Combined forms of age-related cataract, right eye: Secondary | ICD-10-CM | POA: Diagnosis not present

## 2015-12-29 DIAGNOSIS — Z6830 Body mass index (BMI) 30.0-30.9, adult: Secondary | ICD-10-CM | POA: Diagnosis not present

## 2015-12-29 DIAGNOSIS — M1990 Unspecified osteoarthritis, unspecified site: Secondary | ICD-10-CM | POA: Diagnosis not present

## 2015-12-29 DIAGNOSIS — Z7951 Long term (current) use of inhaled steroids: Secondary | ICD-10-CM | POA: Diagnosis not present

## 2015-12-29 DIAGNOSIS — Z961 Presence of intraocular lens: Secondary | ICD-10-CM | POA: Diagnosis not present

## 2015-12-29 DIAGNOSIS — Z955 Presence of coronary angioplasty implant and graft: Secondary | ICD-10-CM | POA: Diagnosis not present

## 2015-12-29 DIAGNOSIS — I2510 Atherosclerotic heart disease of native coronary artery without angina pectoris: Secondary | ICD-10-CM | POA: Diagnosis not present

## 2015-12-29 DIAGNOSIS — Z87891 Personal history of nicotine dependence: Secondary | ICD-10-CM | POA: Diagnosis not present

## 2015-12-29 DIAGNOSIS — H52201 Unspecified astigmatism, right eye: Secondary | ICD-10-CM | POA: Diagnosis not present

## 2015-12-29 DIAGNOSIS — K219 Gastro-esophageal reflux disease without esophagitis: Secondary | ICD-10-CM | POA: Diagnosis not present

## 2015-12-29 MED ORDER — OLOPATADINE HCL 0.6 % NA SOLN: NASAL | Status: DC

## 2016-02-18 ENCOUNTER — Other Ambulatory Visit: Payer: Self-pay | Admitting: Interventional Cardiology

## 2016-02-19 ENCOUNTER — Other Ambulatory Visit: Payer: Self-pay | Admitting: *Deleted

## 2016-02-19 MED ORDER — ROSUVASTATIN CALCIUM 10 MG PO TABS: 10.0000 mg | ORAL_TABLET | Freq: Every day | ORAL | Status: AC

## 2016-03-23 DIAGNOSIS — Z903 Acquired absence of stomach [part of]: Secondary | ICD-10-CM | POA: Diagnosis not present

## 2016-03-23 DIAGNOSIS — E6609 Other obesity due to excess calories: Secondary | ICD-10-CM | POA: Diagnosis not present

## 2016-04-15 DIAGNOSIS — H26491 Other secondary cataract, right eye: Secondary | ICD-10-CM | POA: Diagnosis not present

## 2016-05-02 DIAGNOSIS — K219 Gastro-esophageal reflux disease without esophagitis: Secondary | ICD-10-CM | POA: Diagnosis not present

## 2016-05-02 DIAGNOSIS — Z86010 Personal history of colonic polyps: Secondary | ICD-10-CM | POA: Diagnosis not present

## 2016-05-02 DIAGNOSIS — Z79899 Other long term (current) drug therapy: Secondary | ICD-10-CM | POA: Diagnosis not present

## 2016-05-02 DIAGNOSIS — R7303 Prediabetes: Secondary | ICD-10-CM | POA: Diagnosis not present

## 2016-05-10 DIAGNOSIS — L989 Disorder of the skin and subcutaneous tissue, unspecified: Secondary | ICD-10-CM | POA: Diagnosis not present

## 2016-05-10 DIAGNOSIS — N138 Other obstructive and reflux uropathy: Secondary | ICD-10-CM | POA: Diagnosis not present

## 2016-05-10 DIAGNOSIS — N401 Enlarged prostate with lower urinary tract symptoms: Secondary | ICD-10-CM | POA: Diagnosis not present

## 2016-05-10 DIAGNOSIS — R739 Hyperglycemia, unspecified: Secondary | ICD-10-CM | POA: Diagnosis not present

## 2016-05-10 DIAGNOSIS — E7800 Pure hypercholesterolemia, unspecified: Secondary | ICD-10-CM | POA: Diagnosis not present

## 2016-05-10 DIAGNOSIS — I10 Essential (primary) hypertension: Secondary | ICD-10-CM | POA: Diagnosis not present

## 2016-05-10 DIAGNOSIS — R972 Elevated prostate specific antigen [PSA]: Secondary | ICD-10-CM | POA: Diagnosis not present

## 2016-05-10 DIAGNOSIS — E669 Obesity, unspecified: Secondary | ICD-10-CM | POA: Diagnosis not present

## 2016-05-26 DIAGNOSIS — L814 Other melanin hyperpigmentation: Secondary | ICD-10-CM | POA: Diagnosis not present

## 2016-05-26 DIAGNOSIS — L570 Actinic keratosis: Secondary | ICD-10-CM | POA: Diagnosis not present

## 2016-05-26 DIAGNOSIS — L821 Other seborrheic keratosis: Secondary | ICD-10-CM | POA: Diagnosis not present

## 2016-06-07 ENCOUNTER — Other Ambulatory Visit: Payer: Self-pay | Admitting: *Deleted

## 2016-06-07 MED ORDER — CICLESONIDE 37 MCG/ACT NA AERS: INHALATION_SPRAY | NASAL | 1 refills | Status: DC

## 2016-06-10 ENCOUNTER — Other Ambulatory Visit: Payer: Self-pay | Admitting: *Deleted

## 2016-06-10 MED ORDER — CICLESONIDE 37 MCG/ACT NA AERS
INHALATION_SPRAY | NASAL | 1 refills | Status: DC
Start: 2016-06-10 — End: 2016-08-17

## 2016-07-28 DIAGNOSIS — Z961 Presence of intraocular lens: Secondary | ICD-10-CM | POA: Diagnosis not present

## 2016-07-28 DIAGNOSIS — H43822 Vitreomacular adhesion, left eye: Secondary | ICD-10-CM | POA: Diagnosis not present

## 2016-07-28 DIAGNOSIS — H35342 Macular cyst, hole, or pseudohole, left eye: Secondary | ICD-10-CM | POA: Diagnosis not present

## 2016-08-03 ENCOUNTER — Encounter: Payer: Self-pay | Admitting: Allergy and Immunology

## 2016-08-03 ENCOUNTER — Ambulatory Visit (INDEPENDENT_AMBULATORY_CARE_PROVIDER_SITE_OTHER): Payer: 59 | Admitting: Allergy and Immunology

## 2016-08-03 VITALS — BP 142/82 | HR 60 | Resp 16

## 2016-08-03 DIAGNOSIS — J3089 Other allergic rhinitis: Secondary | ICD-10-CM

## 2016-08-03 DIAGNOSIS — J310 Chronic rhinitis: Secondary | ICD-10-CM

## 2016-08-03 DIAGNOSIS — T485X1A Poisoning by other anti-common-cold drugs, accidental (unintentional), initial encounter: Secondary | ICD-10-CM

## 2016-08-03 DIAGNOSIS — T485X5A Adverse effect of other anti-common-cold drugs, initial encounter: Secondary | ICD-10-CM

## 2016-08-03 NOTE — Progress Notes (Signed)
Follow-up Note  Referring Provider: Lajean Manes, MD Primary Provider: Mathews Argyle, MD Date of Office Visit: 08/03/2016  Subjective:   Arthur Shaw (DOB: [birth year]) is a 70 y.o. male who returns to the [facility] on 08/03/2016 in re-evaluation of the following:  HPI: Arthur Shaw presents to this clinic in reevaluation of his allergic rhinoconjunctivitis and intermittent rhinitis medicamentosa. I've not seen him in his clinic in 1 year.  During the interval he states that his upper airway has been doing relatively well. He uses Afrin about 2-3 times a week. He's tapered off all his medications because he was doing so well until about one month ago at which time he developed nasal congestion and sneezing. He is out of some of his medications at this point and would like to restart his nasal steroid and his montelukast which gave him relief in the past. It should be noted that there is a temporal relationship between the redevelopment of his nasal congestion and starting Flomax for his prostatic hypertrophy.    Medication List      amLODipine-benazepril 10-20 MG capsule Commonly known as:  LOTREL 1 capsule.   benazepril 20 MG tablet Commonly known as:  LOTENSIN Take 20 mg by mouth daily.   Ciclesonide 37 MCG/ACT Aers Commonly known as:  ZETONNA Use one puff in each nostril once daily.   clopidogrel 75 MG tablet Commonly known as:  PLAVIX Take 75 mg by mouth daily.   cyclobenzaprine 10 MG tablet Commonly known as:  FLEXERIL Take 10 mg by mouth 3 (three) times daily as needed.   eszopiclone 2 MG Tabs tablet Commonly known as:  LUNESTA 1 mg.   ezetimibe 10 MG tablet Commonly known as:  ZETIA Take 10 mg by mouth daily.   metoprolol 50 MG tablet Commonly known as:  LOPRESSOR Take 50 mg by mouth 2 (two) times daily.   montelukast 10 MG tablet Commonly known as:  SINGULAIR Take 1 tablet (10 mg total) by mouth daily.   multivitamin  tablet Take 1 tablet by mouth daily.   nitroGLYCERIN 0.4 MG SL tablet Commonly known as:  NITROSTAT Place 1 tablet (0.4 mg total) under the tongue every 5 (five) minutes as needed for chest pain.   Olopatadine HCl 0.6 % Soln USE 2 SPRAYS IN EACH NOSTRIL TWICE A DAY AS NEEDED FOR RUNNY NOSE OR DRAINAGE   phentermine 37.5 MG tablet Commonly known as:  ADIPEX-P Take 37.5 mg by mouth.   rosuvastatin 10 MG tablet Commonly known as:  CRESTOR Take 1 tablet (10 mg total) by mouth daily.   sildenafil 50 MG tablet Commonly known as:  VIAGRA Take 50 mg by mouth.   STOOL SOFTENER 100 MG capsule Generic drug:  docusate sodium 200 mg.   sucralfate 1 GM/10ML suspension Commonly known as:  CARAFATE Take 10 mLs (1 g total) by mouth 4 (four) times daily.   tamsulosin 0.4 MG Caps capsule Commonly known as:  FLOMAX       Past Medical History:  Diagnosis Date  . Adenomatous colon polyp    4/06 FS--[facility]; 9/07 colon: solit sm aden p  . ASCVD (arteriosclerotic cardiovascular disease)    single vessel, s/p DE stent to mid LAD 4/06  . Essential hypertension, benign   . Fibromyalgia    questionable  . Former smoker, stopped smoking in distant past   . GERD (gastroesophageal reflux disease)    UGI 09/2012  . Gout   . Hyperlipidemia   .  Macular hole    at [facility]  . Obesity   . Prediabetes   . Statin intolerance     Past Surgical History:  Procedure Laterality Date  . CORONARY STENT PLACEMENT     taxus stent placed-Dr. Ilda Foil [facility] 01/2005  . STOMACH SURGERY     bariatric surgery 05/19/13  . TONSILLECTOMY      Allergies  Allergen Reactions  . Statins     Other reaction(s): Other (See Comments) memory fatigue  . Welchol [Colesevelam Hcl]     Muscle aches    Review of systems negative except as noted in HPI / PMHx or noted below:  Review of Systems  Constitutional: Negative.   HENT: Negative.   Eyes: Negative.   Respiratory: Negative.   Cardiovascular: Negative.    Gastrointestinal: Negative.   Genitourinary: Negative.   Musculoskeletal: Negative.   Skin: Negative.   Neurological: Negative.   Endo/Heme/Allergies: Negative.   Psychiatric/Behavioral: Negative.      Objective:   Vitals:   08/03/16 1010  BP: (!) 142/82  Pulse: 60  Resp: 16          Physical Exam  Constitutional: He is well-developed, well-nourished, and in no distress.  HENT:  Head: Normocephalic.  Right Ear: Tympanic membrane, external ear and ear canal normal.  Left Ear: Tympanic membrane, external ear and ear canal normal.  Nose: Mucosal edema present. No rhinorrhea.  Mouth/Throat: Uvula is midline, oropharynx is clear and moist and mucous membranes are normal. No oropharyngeal exudate.  Eyes: Conjunctivae are normal.  Neck: Trachea normal. No tracheal tenderness present. No tracheal deviation present. No thyromegaly present.  Cardiovascular: Normal rate, regular rhythm, S1 normal, S2 normal and normal heart sounds.   No murmur heard. Pulmonary/Chest: Breath sounds normal. No stridor. No respiratory distress. He has no wheezes. He has no rales.  Musculoskeletal: He exhibits no edema.  Lymphadenopathy:       Head (right side): No tonsillar adenopathy present.       Head (left side): No tonsillar adenopathy present.    He has no cervical adenopathy.  Neurological: He is alert. Gait normal.  Skin: No rash noted. He is not diaphoretic. No erythema. Nails show no clubbing.  Psychiatric: Mood and affect normal.    Diagnostics: None   Assessment and Plan:   1. Other allergic rhinitis   2. Rhinitis medicamentosa     1. Continue montelukast 10mg  one time a day  2. Continue Zetonna one spray each nostril one time per day  3. Can use patanase 1-2 sprays each nostril one-2 times per day if needed  4. Can use nasal saline multiple times per day if needed  5. Attempt to minimize use of over-the-counter nasal decongestant spray  6. Return in one year or earlier  if problem.  7. Flomax?  I think that Arthur Shaw would do well to consistently use his nasal steroid at some dose throughout the week and consistently uses montelukast to treat his atopic upper airway disease. Of course he can use a topical nasal antihistamine if needed. I've asked him to minimize the use of his nasal decongestant spray as much as possible. If he does not have a good response to this approach then we need to consider the possibility that he may be having a side effect from Flomax with engorgement of the upper airway mucosa as a result of this medication use. He'll keep in contact with me noting his response and he does well I'll see him back in this  clinic in 1 year or earlier if there is a problem.  Allena Katz, MD [place]

## 2016-08-03 NOTE — Patient Instructions (Signed)
  1. Continue montelukast 10mg  one time a day  2. Continue Zetonna one spray each nostril one time per day  3. Can use patanase 1-2 sprays each nostril one-2 times per day if needed  4. Can use nasal saline multiple times per day if needed  5. Attempt to minimize use of over-the-counter nasal decongestant spray  6. Return in one year or earlier if problem.  7. Flomax?

## 2016-08-16 ENCOUNTER — Other Ambulatory Visit: Payer: Self-pay | Admitting: *Deleted

## 2016-08-16 MED ORDER — MONTELUKAST SODIUM 10 MG PO TABS: 10.0000 mg | ORAL_TABLET | Freq: Every day | ORAL | 3 refills | Status: DC

## 2016-08-17 ENCOUNTER — Other Ambulatory Visit: Payer: Self-pay | Admitting: *Deleted

## 2016-08-17 MED ORDER — CICLESONIDE 37 MCG/ACT NA AERS: INHALATION_SPRAY | NASAL | 1 refills | Status: DC

## 2016-09-12 DIAGNOSIS — R972 Elevated prostate specific antigen [PSA]: Secondary | ICD-10-CM | POA: Diagnosis not present

## 2016-09-22 DIAGNOSIS — E669 Obesity, unspecified: Secondary | ICD-10-CM | POA: Diagnosis not present

## 2016-09-22 DIAGNOSIS — Z903 Acquired absence of stomach [part of]: Secondary | ICD-10-CM | POA: Diagnosis not present

## 2016-09-22 DIAGNOSIS — Z6830 Body mass index (BMI) 30.0-30.9, adult: Secondary | ICD-10-CM | POA: Diagnosis not present

## 2016-11-08 DIAGNOSIS — I10 Essential (primary) hypertension: Secondary | ICD-10-CM | POA: Diagnosis not present

## 2016-11-08 DIAGNOSIS — J209 Acute bronchitis, unspecified: Secondary | ICD-10-CM | POA: Diagnosis not present

## 2016-11-08 DIAGNOSIS — I2510 Atherosclerotic heart disease of native coronary artery without angina pectoris: Secondary | ICD-10-CM | POA: Diagnosis not present

## 2016-11-17 ENCOUNTER — Other Ambulatory Visit: Payer: Self-pay

## 2016-11-17 MED ORDER — OLOPATADINE HCL 0.6 % NA SOLN: NASAL | 2 refills | Status: DC

## 2016-12-14 DIAGNOSIS — R0981 Nasal congestion: Secondary | ICD-10-CM | POA: Diagnosis not present

## 2017-02-24 ENCOUNTER — Other Ambulatory Visit: Payer: Self-pay | Admitting: Allergy and Immunology

## 2017-05-29 DIAGNOSIS — Z961 Presence of intraocular lens: Secondary | ICD-10-CM | POA: Diagnosis not present

## 2017-05-29 DIAGNOSIS — Z9842 Cataract extraction status, left eye: Secondary | ICD-10-CM | POA: Diagnosis not present

## 2017-05-29 DIAGNOSIS — Z9841 Cataract extraction status, right eye: Secondary | ICD-10-CM | POA: Diagnosis not present

## 2017-05-29 DIAGNOSIS — H35342 Macular cyst, hole, or pseudohole, left eye: Secondary | ICD-10-CM | POA: Diagnosis not present

## 2017-07-05 DIAGNOSIS — F5101 Primary insomnia: Secondary | ICD-10-CM | POA: Diagnosis not present

## 2017-07-05 DIAGNOSIS — S8012XA Contusion of left lower leg, initial encounter: Secondary | ICD-10-CM | POA: Diagnosis not present

## 2017-07-05 DIAGNOSIS — I10 Essential (primary) hypertension: Secondary | ICD-10-CM | POA: Diagnosis not present

## 2017-07-16 DIAGNOSIS — Z23 Encounter for immunization: Secondary | ICD-10-CM | POA: Diagnosis not present

## 2017-08-16 DIAGNOSIS — E669 Obesity, unspecified: Secondary | ICD-10-CM | POA: Diagnosis not present

## 2017-08-16 DIAGNOSIS — I10 Essential (primary) hypertension: Secondary | ICD-10-CM | POA: Diagnosis not present

## 2017-08-16 DIAGNOSIS — Z6831 Body mass index (BMI) 31.0-31.9, adult: Secondary | ICD-10-CM | POA: Diagnosis not present

## 2017-08-16 DIAGNOSIS — Z713 Dietary counseling and surveillance: Secondary | ICD-10-CM | POA: Diagnosis not present

## 2017-08-20 ENCOUNTER — Other Ambulatory Visit: Payer: Self-pay | Admitting: Allergy and Immunology

## 2017-09-10 ENCOUNTER — Other Ambulatory Visit: Payer: Self-pay | Admitting: Allergy and Immunology

## 2017-09-17 ENCOUNTER — Other Ambulatory Visit: Payer: Self-pay | Admitting: Allergy and Immunology

## 2017-09-18 NOTE — Telephone Encounter (Signed)
Courtesy Refill

## 2017-09-21 ENCOUNTER — Telehealth: Payer: Self-pay | Admitting: Allergy and Immunology

## 2017-09-21 NOTE — Telephone Encounter (Signed)
He has been on a nasal steroid and patanse. Which class does he want to change and which agent will his insurance company clover?

## 2017-09-21 NOTE — Telephone Encounter (Signed)
patient has called stating that nasal spray is to expensive - is there any other medications that the patient can take?? Please call pt to answer any questions Patient uses [facility] on [address]

## 2017-09-21 NOTE — Telephone Encounter (Signed)
Olopatadine nasal spray is the only one I see in chart. Please advise. Patient is also due for an OV.

## 2017-09-21 NOTE — Telephone Encounter (Signed)
Patient is going to see which one specifically they will cover and call me back tomorrow. He is also going to schedule his follow up at that time.

## 2017-09-24 ENCOUNTER — Other Ambulatory Visit: Payer: Self-pay | Admitting: Allergy and Immunology

## 2017-09-28 ENCOUNTER — Telehealth: Payer: Self-pay | Admitting: Allergy and Immunology

## 2017-09-28 NOTE — Telephone Encounter (Signed)
Pt called and needs to have a nasel spray called in that the ins will cover cheaper azelastine . [facility] [phone]. His appointment is on wed fed. 20,2019 at 10:15 am.

## 2017-09-29 NOTE — Telephone Encounter (Signed)
Tried calling pt. Line busy. He was going to call Kayla with a nasal spay on his formulary last week. Need to see what options he has regarding his formulary.

## 2017-10-02 NOTE — Telephone Encounter (Signed)
Pt informed me that he informed Kayla which nasal sprays he could try.  KAYLA  Please look into which every one he told you please!

## 2017-10-03 ENCOUNTER — Other Ambulatory Visit: Payer: Self-pay | Admitting: Allergy and Immunology

## 2017-10-04 NOTE — Telephone Encounter (Signed)
Amber and I have attempted to contact patient with no success. Will attempt again later today.

## 2017-10-09 ENCOUNTER — Other Ambulatory Visit: Payer: Self-pay | Admitting: Allergy and Immunology

## 2017-10-17 DIAGNOSIS — M791 Myalgia, unspecified site: Secondary | ICD-10-CM | POA: Diagnosis not present

## 2017-10-17 DIAGNOSIS — Z79899 Other long term (current) drug therapy: Secondary | ICD-10-CM | POA: Diagnosis not present

## 2017-10-17 DIAGNOSIS — Z6830 Body mass index (BMI) 30.0-30.9, adult: Secondary | ICD-10-CM | POA: Diagnosis not present

## 2017-10-17 DIAGNOSIS — I10 Essential (primary) hypertension: Secondary | ICD-10-CM | POA: Diagnosis not present

## 2017-10-17 DIAGNOSIS — E669 Obesity, unspecified: Secondary | ICD-10-CM | POA: Diagnosis not present

## 2017-11-22 ENCOUNTER — Ambulatory Visit (INDEPENDENT_AMBULATORY_CARE_PROVIDER_SITE_OTHER): Payer: Medicare HMO | Admitting: Allergy and Immunology

## 2017-11-22 ENCOUNTER — Telehealth: Payer: Self-pay

## 2017-11-22 VITALS — BP 114/72 | HR 68 | Resp 20

## 2017-11-22 DIAGNOSIS — J3089 Other allergic rhinitis: Secondary | ICD-10-CM

## 2017-11-22 DIAGNOSIS — L299 Pruritus, unspecified: Secondary | ICD-10-CM | POA: Diagnosis not present

## 2017-11-22 MED ORDER — OLOPATADINE HCL 0.6 % NA SOLN: NASAL | 2 refills | Status: DC

## 2017-11-22 MED ORDER — MOMETASONE FUROATE 50 MCG/ACT NA SUSP: 2.0000 | Freq: Every day | NASAL | 5 refills | Status: DC

## 2017-11-22 NOTE — Telephone Encounter (Signed)
Patient called stating the prescription Lapatidine will cost him $200.00 and he is wondering do we have a copay card or a different prescription that is less costly.

## 2017-11-22 NOTE — Telephone Encounter (Signed)
Called and spoke with patient. I informed him that with medicare they usually do not accept copay cards and the best way to find out what they do cover is to call his insurance and ask. Patient stated he would call his insurance company and ask then call us back.

## 2017-11-22 NOTE — Progress Notes (Signed)
Follow-up Note  Referring Provider: Lajean Manes, MD Primary Provider: Lajean Manes, MD Date of Office Visit: 11/22/2017  Subjective:   Arthur Shaw (DOB: October 05, 1945) is a 72 y.o. male who returns to the [facility] on 11/22/2017 in re-evaluation of the following:  HPI: Arthur Shaw presents to this clinic in reevaluation of his allergic rhinoconjunctivitis and a new onset dermatitis.  He was last seen in this clinic 13 August 2016.  His upper airway is doing wonderful and he really has no issues revolving around nasal congestion.  He rarely uses over-the-counter Afrin at this point in time.  He likes the result that he is receiving with his nasal steroid and nasal antihistamine and no longer uses montelukast.  What is new for Arthur Shaw is the development of a itchy dermatitis.  He has noticed over the course of the past month that he is very itchy and he is excoriating his skin.  He has no associated systemic or constitutional symptoms.  Apparently within the past month or so he has had blood tests performed by his primary care doctor the results of which are not available for review.  There is no obvious provoking factor giving rise to this issue.  He has not really had a significant environmental change.  However, it should be noted that he did have manipulation of his medications at the beginning of this year and he thinks that he may have had a change in his high blood pressure medicine and may be a change in his cholesterol medicine although he is not entirely clear about this issue.  Allergies as of 11/22/2017      Reactions   Statins    Other reaction(s): Other (See Comments) memory fatigue   Welchol [colesevelam Hcl]    Muscle aches      Medication List      amLODipine-benazepril 10-20 MG capsule Commonly known as:  LOTREL 1 capsule.   azelastine 0.1 % nasal spray Commonly known as:  ASTELIN 1 spray as needed.   benazepril 20 MG tablet Commonly known  as:  LOTENSIN Take 20 mg by mouth daily.   Calcium Carb-Ergocalciferol 250-125 MG-UNIT Tabs 1 tablet 3 times daily.   cetirizine 10 MG tablet Commonly known as:  ZYRTEC 10 mg daily.   clopidogrel 75 MG tablet Commonly known as:  PLAVIX Take 75 mg by mouth daily.   cyclobenzaprine 10 MG tablet Commonly known as:  FLEXERIL Take 10 mg by mouth 3 (three) times daily as needed.   eszopiclone 2 MG Tabs tablet Commonly known as:  LUNESTA 1 mg.   ezetimibe 10 MG tablet Commonly known as:  ZETIA Take 10 mg by mouth daily.   hydrochlorothiazide 12.5 MG tablet Commonly known as:  HYDRODIURIL   metoprolol tartrate 50 MG tablet Commonly known as:  LOPRESSOR Take 50 mg by mouth 2 (two) times daily.   multivitamin tablet Take 1 tablet by mouth daily.   nitroGLYCERIN 0.4 MG SL tablet Commonly known as:  NITROSTAT Place 1 tablet (0.4 mg total) under the tongue every 5 (five) minutes as needed for chest pain.   Olopatadine HCl 0.6 % Soln USE 1-2 SPRAYS IN EACH NOSTRIL 1-2 TIMES DAILY AS NEEDED FOR RUNNY NOSE OR DRAINAGE   pantoprazole 40 MG tablet Commonly known as:  PROTONIX   rosuvastatin 10 MG tablet Commonly known as:  CRESTOR Take 1 tablet (10 mg total) by mouth daily.   sildenafil 50 MG tablet Commonly known as:  VIAGRA  Take 50 mg by mouth.   STOOL SOFTENER 100 MG capsule Generic drug:  docusate sodium 200 mg.   tamsulosin 0.4 MG Caps capsule Commonly known as:  FLOMAX   topiramate 50 MG tablet Commonly known as:  TOPAMAX Take by mouth.   ZETONNA 37 MCG/ACT Aers Generic drug:  Ciclesonide SPRAY ONE SPRAY IN EACH NOSTRIL ONCE DAILY       Past Medical History:  Diagnosis Date  . Adenomatous colon polyp    4/06 FS--[facility]; 9/07 colon: [facility] sm aden p  . ASCVD (arteriosclerotic cardiovascular disease)    single vessel, s/p DE stent to mid LAD 4/06  . Essential hypertension, benign   . Fibromyalgia    questionable  . Former smoker, stopped smoking in  distant past   . GERD (gastroesophageal reflux disease)    UGI 09/2012  . Gout   . Hyperlipidemia   . Macular hole    at [facility]  . Obesity   . Prediabetes   . Statin intolerance     Past Surgical History:  Procedure Laterality Date  . CORONARY STENT PLACEMENT     taxus stent placed-Dr. Ilda Foil LAS 01/2005  . STOMACH SURGERY     bariatric surgery 05/19/13  . TONSILLECTOMY      Review of systems negative except as noted in HPI / PMHx or noted below:  Review of Systems  Constitutional: Negative.   HENT: Negative.   Eyes: Negative.   Respiratory: Negative.   Cardiovascular: Negative.   Gastrointestinal: Negative.   Genitourinary: Negative.   Musculoskeletal: Negative.   Skin: Negative.   Neurological: Negative.   Endo/Heme/Allergies: Negative.   Psychiatric/Behavioral: Negative.      Objective:   Vitals:   11/22/17 1014  BP: 114/72  Pulse: 68  Resp: 20          Physical Exam  Constitutional: He is well-developed, well-nourished, and in no distress.  HENT:  Head: Normocephalic.  Right Ear: Tympanic membrane, external ear and ear canal normal.  Left Ear: Tympanic membrane, external ear and ear canal normal.  Nose: Nose normal. No mucosal edema or rhinorrhea.  Mouth/Throat: Uvula is midline, oropharynx is clear and moist and mucous membranes are normal. No oropharyngeal exudate.  Eyes: Conjunctivae are normal.  Neck: Trachea normal. No tracheal tenderness present. No tracheal deviation present. No thyromegaly present.  Cardiovascular: Normal rate, regular rhythm, S1 normal, S2 normal and normal heart sounds.  No murmur heard. Pulmonary/Chest: Breath sounds normal. No stridor. No respiratory distress. He has no wheezes. He has no rales.  Musculoskeletal: He exhibits no edema.  Lymphadenopathy:       Head (right side): No tonsillar adenopathy present.       Head (left side): No tonsillar adenopathy present.    He has no cervical adenopathy.  Neurological: He  is alert. Gait normal.  Skin: Rash (Patchy slightly erythematous nonindurated excoriated areas involving abdomen and lower back.) noted. He is not diaphoretic. No erythema. Nails show no clubbing.  Psychiatric: Mood and affect normal.    Diagnostics: none  Assessment and Plan:   1. Other allergic rhinitis   2. Pruritic disorder     1.  Use generic nasonex 1 spray each nostril 1 time per day (no Zetonna)  2. Can use patanase 1-2 sprays each nostril one-2 times per day if needed  3. Can use nasal saline multiple times per day if needed  4. Start OTC cetirizine 10mg  two times a day for itchiness  5. Review blood tests from  Dr. Felipa Eth. Further testing?  6. May need to alter medications  Ziad has very good control of his upper airway disease with a nasal steroid and a nasal antihistamine.  Because of an insurance issue we will need to change his nasal steroid as noted above.  The etiology of his pruritic disorder is unknown.  Given the fact that he has had some changes in medications at the beginning of this year it is quite possible that he is having a drug side effect.  We will see what type of blood tests have been performed by Dr. Felipa Eth and consider further evaluation with further testing based upon those results.  He and his wife will review his medication plan and the changes that were made at the beginning of the year and relay that information back to [place] within the next week.  Allena Katz, MD [facility] [place]

## 2017-11-22 NOTE — Patient Instructions (Addendum)
  1.  Use generic nasonex 1 spray each nostril 1 time per day (no Zetonna)  2. Can use patanase 1-2 sprays each nostril one-2 times per day if needed  3. Can use nasal saline multiple times per day if needed  4. Start OTC cetirizine 10mg  two times a day for itchiness  5. Review blood tests from Dr. Felipa Eth. Further testing?  6. May need to alter medications

## 2017-11-23 ENCOUNTER — Encounter: Payer: Self-pay | Admitting: Allergy and Immunology

## 2017-11-27 ENCOUNTER — Telehealth: Payer: Self-pay | Admitting: Allergy and Immunology

## 2017-11-27 DIAGNOSIS — L299 Pruritus, unspecified: Secondary | ICD-10-CM

## 2017-11-27 NOTE — Telephone Encounter (Signed)
Patient saw Dr. Neldon Mc, 11-22-17, for some itching on his abdomen. He was given some medication, and Dr. Neldon Mc was going to check on some blood work that Dr. Felipa Eth had taken. Patient is wanting to know if Dr. Neldon Mc has found out any information on that, because he is still itching.

## 2017-11-27 NOTE — Telephone Encounter (Signed)
Spoke to patient advised we did receive blood work and it is on Dr [employer] for him to review. Advised that once reviewed we would call him back with recommendations. Dr Neldon Mc please advise

## 2017-11-29 NOTE — Telephone Encounter (Signed)
Please inform patient that I reviewed his blood test from Dr. Felipa Eth. He needs CMP, CBC w/diff, TSH, T4, TP, SED + UA

## 2017-11-29 NOTE — Telephone Encounter (Signed)
Patient has been informed of the need for more lab work. I have entere the labs and will give to [facility] personnel to draw when patient arrives.

## 2017-12-06 ENCOUNTER — Other Ambulatory Visit: Payer: Self-pay | Admitting: Allergy and Immunology

## 2017-12-14 DIAGNOSIS — L299 Pruritus, unspecified: Secondary | ICD-10-CM | POA: Diagnosis not present

## 2017-12-15 ENCOUNTER — Other Ambulatory Visit: Payer: Self-pay | Admitting: Allergy and Immunology

## 2017-12-15 DIAGNOSIS — L299 Pruritus, unspecified: Secondary | ICD-10-CM

## 2017-12-15 LAB — COMPREHENSIVE METABOLIC PANEL
A/G RATIO: 1.4 (ref 1.2–2.2)
ALT: 18 IU/L (ref 0–44)
AST: 20 IU/L (ref 0–40)
Albumin: 4 g/dL (ref 3.5–4.8)
Alkaline Phosphatase: 84 IU/L (ref 39–117)
BILIRUBIN TOTAL: 0.5 mg/dL (ref 0.0–1.2)
BUN/Creatinine Ratio: 15 (ref 10–24)
BUN: 11 mg/dL (ref 8–27)
CALCIUM: 9.1 mg/dL (ref 8.6–10.2)
CHLORIDE: 98 mmol/L (ref 96–106)
CO2: 23 mmol/L (ref 20–29)
Creatinine, Ser: 0.73 mg/dL — ABNORMAL LOW (ref 0.76–1.27)
GFR, EST AFRICAN AMERICAN: 109 mL/min/{1.73_m2} (ref >59)
GFR, EST NON AFRICAN AMERICAN: 94 mL/min/{1.73_m2} (ref >59)
GLUCOSE: 118 mg/dL — AB (ref 65–99)
Globulin, Total: 2.8 g/dL (ref 1.5–4.5)
POTASSIUM: 4.4 mmol/L (ref 3.5–5.2)
Sodium: 137 mmol/L (ref 134–144)
TOTAL PROTEIN: 6.8 g/dL (ref 6.0–8.5)

## 2017-12-15 LAB — SEDIMENTATION RATE: Sed Rate: 31 mm/hr — ABNORMAL HIGH (ref 0–30)

## 2017-12-15 LAB — CBC WITH DIFFERENTIAL/PLATELET
BASOS ABS: 0.1 10*3/uL (ref 0.0–0.2)
Basos: 2 %
EOS (ABSOLUTE): 0.2 10*3/uL (ref 0.0–0.4)
Eos: 3 %
HEMOGLOBIN: 15.1 g/dL (ref 13.0–17.7)
Hematocrit: 43 % (ref 37.5–51.0)
IMMATURE GRANS (ABS): 0 10*3/uL (ref 0.0–0.1)
IMMATURE GRANULOCYTES: 0 %
LYMPHS: 24 %
Lymphocytes Absolute: 1.6 10*3/uL (ref 0.7–3.1)
MCH: 29.7 pg (ref 26.6–33.0)
MCHC: 35.1 g/dL (ref 31.5–35.7)
MCV: 85 fL (ref 79–97)
MONOCYTES: 8 %
Monocytes Absolute: 0.5 10*3/uL (ref 0.1–0.9)
NEUTROS ABS: 4.3 10*3/uL (ref 1.4–7.0)
NEUTROS PCT: 63 %
Platelets: 296 10*3/uL (ref 150–379)
RBC: 5.09 x10E6/uL (ref 4.14–5.80)
RDW: 15.8 % — ABNORMAL HIGH (ref 12.3–15.4)
WBC: 6.8 10*3/uL (ref 3.4–10.8)

## 2017-12-15 LAB — TSH: TSH: 1.37 u[IU]/mL (ref 0.450–4.500)

## 2017-12-15 LAB — T4, FREE: Free T4: 1.3 ng/dL (ref 0.82–1.77)

## 2017-12-15 LAB — THYROID PEROXIDASE ANTIBODY: THYROID PEROXIDASE ANTIBODY: 12 [IU]/mL (ref 0–34)

## 2017-12-15 LAB — SPECIMEN STATUS REPORT

## 2017-12-16 LAB — URINALYSIS
Bilirubin, UA: NEGATIVE
Glucose, UA: NEGATIVE
Ketones, UA: NEGATIVE
LEUKOCYTES UA: NEGATIVE
NITRITE UA: NEGATIVE
PH UA: 6.5 (ref 5.0–7.5)
Protein, UA: NEGATIVE
RBC UA: NEGATIVE
Specific Gravity, UA: 1.02 (ref 1.005–1.030)
UUROB: 1 mg/dL (ref 0.2–1.0)

## 2017-12-18 NOTE — Telephone Encounter (Signed)
Informed pt about labs. He would like to know if he could get some prednisone to help with his itching? Advised him to take cetirizine twice a day.

## 2017-12-20 NOTE — Telephone Encounter (Signed)
Spoke with Dr. Neldon Mc, he states, pt was taken care of

## 2018-03-28 ENCOUNTER — Telehealth: Payer: Self-pay | Admitting: *Deleted

## 2018-03-28 NOTE — Telephone Encounter (Signed)
I informed pt that if the skin is not broken or there is no drainage, the best thing to do is to tape the toenail down enough so it would not snag and be pulled off. I told pt if the area has broken skin soak in 1/4 cup epsom salt and 1 qt of warm water daily and cover with antibiotic ointment bandaid. I told pt if the nail came off and he wanted it tested for fungus to bring in the office and they may send to a lab. I told pt if he had signs of infection, redness, swelling and drainage to call for possible earlier appt. Pt states understanding.

## 2018-03-28 NOTE — Telephone Encounter (Signed)
Pt states right toenail is lifting may have fungus, and is taping the toenail down, what to do until seen in office on 04/03/2018.

## 2018-04-03 ENCOUNTER — Encounter: Payer: Self-pay | Admitting: Podiatry

## 2018-04-03 ENCOUNTER — Ambulatory Visit: Payer: Medicare HMO | Admitting: Podiatry

## 2018-04-03 VITALS — BP 132/83 | HR 70

## 2018-04-03 DIAGNOSIS — W450XXA Nail entering through skin, initial encounter: Secondary | ICD-10-CM | POA: Diagnosis not present

## 2018-04-03 DIAGNOSIS — B351 Tinea unguium: Secondary | ICD-10-CM

## 2018-04-03 DIAGNOSIS — M79675 Pain in left toe(s): Secondary | ICD-10-CM

## 2018-04-03 NOTE — Progress Notes (Signed)
This patient presents the office for an evaluation of the toenails on both big toes.  He says that about 5 days ago. his nail lifted up and separated from the nailbed right big toe.  He says he pushed back into position and is not causing any pain or discomfort at present.   He presents the office today with his wife.  He also says that he has a thick disfigured discolored big toenail on the left foot.  He says there is occasional pain and discomfort along the inside border of the big toenail, right foot.  He presents the office today for an evaluation and treatment of both big toenails both feet. Patient presently takes Plavix.   General Appearance  Alert, conversant and in no acute stress.  Vascular  Dorsalis pedis and posterior tibial  pulses are palpable  bilaterally.  Capillary return is within normal limits  bilaterally. Temperature is within normal limits  bilaterally.  Neurologic  Senn-Weinstein monofilament wire test within normal limits  bilaterally. Muscle power within normal limits bilaterally.  Nails ail plate has separated from the nailbed on the right great toe.  No signs of redness, swelling or drainage.  The medial border of the left hallux reveals was superficial spreading fungus medial border.  Orthopedic  No limitations of motion of motion feet .  No crepitus or effusions noted.  No bony pathology or digital deformities noted.  Skin  normotropic skin with no porokeratosis noted bilaterally.  No signs of infections or ulcers noted.    Nail Injury right hallux.  Onychomycosis left hallux.  IE for nail injury right hallux.  Debridement of fungus left hallux.  Recommend Formula 7 to be applied to left hallux.  RTC prn.  Gardiner Barefoot DPM

## 2018-04-05 ENCOUNTER — Other Ambulatory Visit: Payer: Self-pay | Admitting: Allergy and Immunology

## 2018-04-09 NOTE — Telephone Encounter (Signed)
Courtesy refill  

## 2018-05-25 DIAGNOSIS — Z79899 Other long term (current) drug therapy: Secondary | ICD-10-CM | POA: Diagnosis not present

## 2018-05-25 DIAGNOSIS — K9089 Other intestinal malabsorption: Secondary | ICD-10-CM | POA: Diagnosis not present

## 2018-05-25 DIAGNOSIS — Z0000 Encounter for general adult medical examination without abnormal findings: Secondary | ICD-10-CM | POA: Diagnosis not present

## 2018-05-25 DIAGNOSIS — E7800 Pure hypercholesterolemia, unspecified: Secondary | ICD-10-CM | POA: Diagnosis not present

## 2018-05-25 DIAGNOSIS — K911 Postgastric surgery syndromes: Secondary | ICD-10-CM | POA: Diagnosis not present

## 2018-05-25 DIAGNOSIS — I10 Essential (primary) hypertension: Secondary | ICD-10-CM | POA: Diagnosis not present

## 2018-05-25 DIAGNOSIS — M7711 Lateral epicondylitis, right elbow: Secondary | ICD-10-CM | POA: Diagnosis not present

## 2018-05-25 DIAGNOSIS — Z1389 Encounter for screening for other disorder: Secondary | ICD-10-CM | POA: Diagnosis not present

## 2018-05-25 DIAGNOSIS — R972 Elevated prostate specific antigen [PSA]: Secondary | ICD-10-CM | POA: Diagnosis not present

## 2018-05-25 DIAGNOSIS — E669 Obesity, unspecified: Secondary | ICD-10-CM | POA: Diagnosis not present

## 2018-05-25 DIAGNOSIS — E1169 Type 2 diabetes mellitus with other specified complication: Secondary | ICD-10-CM | POA: Diagnosis not present

## 2018-05-25 DIAGNOSIS — K219 Gastro-esophageal reflux disease without esophagitis: Secondary | ICD-10-CM | POA: Diagnosis not present

## 2018-06-07 DIAGNOSIS — H35342 Macular cyst, hole, or pseudohole, left eye: Secondary | ICD-10-CM | POA: Diagnosis not present

## 2018-06-07 DIAGNOSIS — Z961 Presence of intraocular lens: Secondary | ICD-10-CM | POA: Diagnosis not present

## 2018-06-29 DIAGNOSIS — R69 Illness, unspecified: Secondary | ICD-10-CM | POA: Diagnosis not present

## 2018-07-04 ENCOUNTER — Other Ambulatory Visit: Payer: Self-pay | Admitting: Allergy and Immunology

## 2018-08-01 DIAGNOSIS — R972 Elevated prostate specific antigen [PSA]: Secondary | ICD-10-CM | POA: Diagnosis not present

## 2018-08-01 DIAGNOSIS — N400 Enlarged prostate without lower urinary tract symptoms: Secondary | ICD-10-CM | POA: Diagnosis not present

## 2018-08-01 DIAGNOSIS — N5201 Erectile dysfunction due to arterial insufficiency: Secondary | ICD-10-CM | POA: Diagnosis not present

## 2018-08-15 DIAGNOSIS — R69 Illness, unspecified: Secondary | ICD-10-CM | POA: Diagnosis not present

## 2018-08-15 DIAGNOSIS — E1169 Type 2 diabetes mellitus with other specified complication: Secondary | ICD-10-CM | POA: Diagnosis not present

## 2018-08-15 DIAGNOSIS — I10 Essential (primary) hypertension: Secondary | ICD-10-CM | POA: Diagnosis not present

## 2018-08-15 DIAGNOSIS — E7800 Pure hypercholesterolemia, unspecified: Secondary | ICD-10-CM | POA: Diagnosis not present

## 2018-09-23 ENCOUNTER — Other Ambulatory Visit: Payer: Self-pay | Admitting: Allergy and Immunology

## 2018-10-05 DIAGNOSIS — J019 Acute sinusitis, unspecified: Secondary | ICD-10-CM | POA: Diagnosis not present

## 2018-12-22 ENCOUNTER — Other Ambulatory Visit: Payer: Self-pay | Admitting: Allergy and Immunology

## 2019-01-29 DIAGNOSIS — I2510 Atherosclerotic heart disease of native coronary artery without angina pectoris: Secondary | ICD-10-CM | POA: Diagnosis not present

## 2019-01-29 DIAGNOSIS — E1169 Type 2 diabetes mellitus with other specified complication: Secondary | ICD-10-CM | POA: Diagnosis not present

## 2019-01-29 DIAGNOSIS — E7800 Pure hypercholesterolemia, unspecified: Secondary | ICD-10-CM | POA: Diagnosis not present

## 2019-01-29 DIAGNOSIS — I10 Essential (primary) hypertension: Secondary | ICD-10-CM | POA: Diagnosis not present

## 2019-02-04 ENCOUNTER — Other Ambulatory Visit: Payer: Self-pay | Admitting: Allergy and Immunology

## 2019-02-05 DIAGNOSIS — E1169 Type 2 diabetes mellitus with other specified complication: Secondary | ICD-10-CM | POA: Diagnosis not present

## 2019-02-05 DIAGNOSIS — I2510 Atherosclerotic heart disease of native coronary artery without angina pectoris: Secondary | ICD-10-CM | POA: Diagnosis not present

## 2019-02-05 DIAGNOSIS — E7800 Pure hypercholesterolemia, unspecified: Secondary | ICD-10-CM | POA: Diagnosis not present

## 2019-02-05 DIAGNOSIS — I10 Essential (primary) hypertension: Secondary | ICD-10-CM | POA: Diagnosis not present

## 2019-03-21 DIAGNOSIS — E7800 Pure hypercholesterolemia, unspecified: Secondary | ICD-10-CM | POA: Diagnosis not present

## 2019-03-21 DIAGNOSIS — E1169 Type 2 diabetes mellitus with other specified complication: Secondary | ICD-10-CM | POA: Diagnosis not present

## 2019-03-21 DIAGNOSIS — I2510 Atherosclerotic heart disease of native coronary artery without angina pectoris: Secondary | ICD-10-CM | POA: Diagnosis not present

## 2019-03-21 DIAGNOSIS — I10 Essential (primary) hypertension: Secondary | ICD-10-CM | POA: Diagnosis not present

## 2019-04-26 DIAGNOSIS — E1169 Type 2 diabetes mellitus with other specified complication: Secondary | ICD-10-CM | POA: Diagnosis not present

## 2019-04-26 DIAGNOSIS — I2510 Atherosclerotic heart disease of native coronary artery without angina pectoris: Secondary | ICD-10-CM | POA: Diagnosis not present

## 2019-04-26 DIAGNOSIS — I10 Essential (primary) hypertension: Secondary | ICD-10-CM | POA: Diagnosis not present

## 2019-04-26 DIAGNOSIS — E7800 Pure hypercholesterolemia, unspecified: Secondary | ICD-10-CM | POA: Diagnosis not present

## 2019-05-29 DIAGNOSIS — I2510 Atherosclerotic heart disease of native coronary artery without angina pectoris: Secondary | ICD-10-CM | POA: Diagnosis not present

## 2019-05-29 DIAGNOSIS — E7800 Pure hypercholesterolemia, unspecified: Secondary | ICD-10-CM | POA: Diagnosis not present

## 2019-05-29 DIAGNOSIS — E1169 Type 2 diabetes mellitus with other specified complication: Secondary | ICD-10-CM | POA: Diagnosis not present

## 2019-05-29 DIAGNOSIS — I10 Essential (primary) hypertension: Secondary | ICD-10-CM | POA: Diagnosis not present

## 2019-06-14 DIAGNOSIS — L237 Allergic contact dermatitis due to plants, except food: Secondary | ICD-10-CM | POA: Diagnosis not present

## 2019-06-14 DIAGNOSIS — Z23 Encounter for immunization: Secondary | ICD-10-CM | POA: Diagnosis not present

## 2019-07-03 DIAGNOSIS — Z8679 Personal history of other diseases of the circulatory system: Secondary | ICD-10-CM | POA: Diagnosis not present

## 2019-07-03 DIAGNOSIS — H524 Presbyopia: Secondary | ICD-10-CM | POA: Diagnosis not present

## 2019-07-03 DIAGNOSIS — H5211 Myopia, right eye: Secondary | ICD-10-CM | POA: Diagnosis not present

## 2019-07-03 DIAGNOSIS — H35443 Age-related reticular degeneration of retina, bilateral: Secondary | ICD-10-CM | POA: Diagnosis not present

## 2019-07-03 DIAGNOSIS — H538 Other visual disturbances: Secondary | ICD-10-CM | POA: Diagnosis not present

## 2019-07-03 DIAGNOSIS — H3554 Dystrophies primarily involving the retinal pigment epithelium: Secondary | ICD-10-CM | POA: Diagnosis not present

## 2019-07-03 DIAGNOSIS — H35342 Macular cyst, hole, or pseudohole, left eye: Secondary | ICD-10-CM | POA: Diagnosis not present

## 2019-07-03 DIAGNOSIS — H52201 Unspecified astigmatism, right eye: Secondary | ICD-10-CM | POA: Diagnosis not present

## 2019-07-03 DIAGNOSIS — Z961 Presence of intraocular lens: Secondary | ICD-10-CM | POA: Diagnosis not present

## 2019-07-03 DIAGNOSIS — Z83518 Family history of other specified eye disorder: Secondary | ICD-10-CM | POA: Diagnosis not present

## 2019-08-15 DIAGNOSIS — I10 Essential (primary) hypertension: Secondary | ICD-10-CM | POA: Diagnosis not present

## 2019-08-15 DIAGNOSIS — Z79899 Other long term (current) drug therapy: Secondary | ICD-10-CM | POA: Diagnosis not present

## 2019-08-15 DIAGNOSIS — Z0000 Encounter for general adult medical examination without abnormal findings: Secondary | ICD-10-CM | POA: Diagnosis not present

## 2019-08-15 DIAGNOSIS — E7800 Pure hypercholesterolemia, unspecified: Secondary | ICD-10-CM | POA: Diagnosis not present

## 2019-08-15 DIAGNOSIS — G5711 Meralgia paresthetica, right lower limb: Secondary | ICD-10-CM | POA: Diagnosis not present

## 2019-08-15 DIAGNOSIS — Z1389 Encounter for screening for other disorder: Secondary | ICD-10-CM | POA: Diagnosis not present

## 2019-08-15 DIAGNOSIS — K219 Gastro-esophageal reflux disease without esophagitis: Secondary | ICD-10-CM | POA: Diagnosis not present

## 2019-08-15 DIAGNOSIS — R972 Elevated prostate specific antigen [PSA]: Secondary | ICD-10-CM | POA: Diagnosis not present

## 2019-08-15 DIAGNOSIS — L570 Actinic keratosis: Secondary | ICD-10-CM | POA: Diagnosis not present

## 2019-08-15 DIAGNOSIS — E1169 Type 2 diabetes mellitus with other specified complication: Secondary | ICD-10-CM | POA: Diagnosis not present

## 2019-08-22 DIAGNOSIS — D485 Neoplasm of uncertain behavior of skin: Secondary | ICD-10-CM | POA: Diagnosis not present

## 2019-08-22 DIAGNOSIS — L570 Actinic keratosis: Secondary | ICD-10-CM | POA: Diagnosis not present

## 2019-11-04 ENCOUNTER — Ambulatory Visit: Payer: 59

## 2019-11-08 DIAGNOSIS — R972 Elevated prostate specific antigen [PSA]: Secondary | ICD-10-CM | POA: Diagnosis not present

## 2019-11-10 ENCOUNTER — Ambulatory Visit: Payer: PPO | Attending: Internal Medicine

## 2019-11-10 DIAGNOSIS — Z23 Encounter for immunization: Secondary | ICD-10-CM | POA: Insufficient documentation

## 2019-11-10 NOTE — Progress Notes (Signed)
   Covid-19 Vaccination Clinic  Name:  Arthur Shaw    MRN: [id number] DOB: Sep 09, 1946  11/10/2019  [name] was observed post Covid-19 immunization for 15 minutes without incidence. He was provided with Vaccine Information Sheet and instruction to access the V-Safe system.   [name] was instructed to call 911 with any severe reactions post vaccine: Marland Kitchen Difficulty breathing  . Swelling of your face and throat  . A fast heartbeat  . A bad rash all over your body  . Dizziness and weakness    Immunizations Administered    Name Date Dose VIS Date Route   Pfizer COVID-19 Vaccine 11/10/2019 12:08 PM 0.3 mL 09/13/2019 Intramuscular   Manufacturer: [place]   Lot: [id number]   [place]: [id number]

## 2019-11-11 DIAGNOSIS — R972 Elevated prostate specific antigen [PSA]: Secondary | ICD-10-CM | POA: Diagnosis not present

## 2019-12-04 ENCOUNTER — Ambulatory Visit: Payer: PPO | Attending: Internal Medicine

## 2019-12-04 DIAGNOSIS — Z23 Encounter for immunization: Secondary | ICD-10-CM | POA: Insufficient documentation

## 2019-12-04 NOTE — Progress Notes (Signed)
   Covid-19 Vaccination Clinic  Name:  Arthur Shaw    MRN: [id number] DOB: 25-Nov-1945  12/04/2019  [name] was observed post Covid-19 immunization for 15 minutes without incident. He was provided with Vaccine Information Sheet and instruction to access the V-Safe system.   [name] was instructed to call 911 with any severe reactions post vaccine: Marland Kitchen Difficulty breathing  . Swelling of face and throat  . A fast heartbeat  . A bad rash all over body  . Dizziness and weakness   Immunizations Administered    Name Date Dose VIS Date Route   Pfizer COVID-19 Vaccine 12/04/2019  8:38 AM 0.3 mL 09/13/2019 Intramuscular   Manufacturer: [place]   Lot: [id number]   [place]: [id number]

## 2020-02-14 DIAGNOSIS — R21 Rash and other nonspecific skin eruption: Secondary | ICD-10-CM | POA: Diagnosis not present

## 2020-02-14 DIAGNOSIS — E1169 Type 2 diabetes mellitus with other specified complication: Secondary | ICD-10-CM | POA: Diagnosis not present

## 2020-02-14 DIAGNOSIS — Z79899 Other long term (current) drug therapy: Secondary | ICD-10-CM | POA: Diagnosis not present

## 2020-02-14 DIAGNOSIS — I10 Essential (primary) hypertension: Secondary | ICD-10-CM | POA: Diagnosis not present

## 2020-02-28 DIAGNOSIS — E1169 Type 2 diabetes mellitus with other specified complication: Secondary | ICD-10-CM | POA: Diagnosis not present

## 2020-02-28 DIAGNOSIS — I10 Essential (primary) hypertension: Secondary | ICD-10-CM | POA: Diagnosis not present

## 2020-04-28 DIAGNOSIS — I10 Essential (primary) hypertension: Secondary | ICD-10-CM | POA: Diagnosis not present

## 2020-06-11 DIAGNOSIS — Z961 Presence of intraocular lens: Secondary | ICD-10-CM | POA: Diagnosis not present

## 2020-06-11 DIAGNOSIS — H35342 Macular cyst, hole, or pseudohole, left eye: Secondary | ICD-10-CM | POA: Diagnosis not present

## 2020-07-20 DIAGNOSIS — L821 Other seborrheic keratosis: Secondary | ICD-10-CM | POA: Diagnosis not present

## 2020-07-20 DIAGNOSIS — B353 Tinea pedis: Secondary | ICD-10-CM | POA: Diagnosis not present

## 2020-07-20 DIAGNOSIS — I10 Essential (primary) hypertension: Secondary | ICD-10-CM | POA: Diagnosis not present

## 2020-07-20 DIAGNOSIS — M545 Low back pain, unspecified: Secondary | ICD-10-CM | POA: Diagnosis not present

## 2020-08-21 DIAGNOSIS — K219 Gastro-esophageal reflux disease without esophagitis: Secondary | ICD-10-CM | POA: Diagnosis not present

## 2020-08-21 DIAGNOSIS — E7800 Pure hypercholesterolemia, unspecified: Secondary | ICD-10-CM | POA: Diagnosis not present

## 2020-08-21 DIAGNOSIS — B372 Candidiasis of skin and nail: Secondary | ICD-10-CM | POA: Diagnosis not present

## 2020-08-21 DIAGNOSIS — Z1389 Encounter for screening for other disorder: Secondary | ICD-10-CM | POA: Diagnosis not present

## 2020-08-21 DIAGNOSIS — Z79899 Other long term (current) drug therapy: Secondary | ICD-10-CM | POA: Diagnosis not present

## 2020-08-21 DIAGNOSIS — Z1159 Encounter for screening for other viral diseases: Secondary | ICD-10-CM | POA: Diagnosis not present

## 2020-08-21 DIAGNOSIS — I10 Essential (primary) hypertension: Secondary | ICD-10-CM | POA: Diagnosis not present

## 2020-08-21 DIAGNOSIS — Z0000 Encounter for general adult medical examination without abnormal findings: Secondary | ICD-10-CM | POA: Diagnosis not present

## 2020-08-21 DIAGNOSIS — E1169 Type 2 diabetes mellitus with other specified complication: Secondary | ICD-10-CM | POA: Diagnosis not present

## 2020-10-28 DIAGNOSIS — L308 Other specified dermatitis: Secondary | ICD-10-CM | POA: Diagnosis not present

## 2020-11-23 DIAGNOSIS — R202 Paresthesia of skin: Secondary | ICD-10-CM | POA: Diagnosis not present

## 2020-11-24 ENCOUNTER — Encounter: Payer: Self-pay | Admitting: Neurology

## 2021-01-04 ENCOUNTER — Other Ambulatory Visit: Payer: Self-pay

## 2021-01-04 DIAGNOSIS — R202 Paresthesia of skin: Secondary | ICD-10-CM

## 2021-01-05 ENCOUNTER — Other Ambulatory Visit: Payer: Self-pay

## 2021-01-05 ENCOUNTER — Ambulatory Visit (INDEPENDENT_AMBULATORY_CARE_PROVIDER_SITE_OTHER): Payer: PPO | Admitting: Neurology

## 2021-01-05 DIAGNOSIS — R202 Paresthesia of skin: Secondary | ICD-10-CM

## 2021-01-05 DIAGNOSIS — G5621 Lesion of ulnar nerve, right upper limb: Secondary | ICD-10-CM

## 2021-01-05 NOTE — Procedures (Signed)
[facility]  [address]  [place],  [address] Tel: [phone] Fax:  [phone] Test Date:  01/05/2021  Patient: Arthur Shaw DOB: 10-17-1945 Physician: Narda Amber, DO  Sex: Male Height: 5\' 11"  Ref Phys: Lajean Manes, MD  ID#: [id number]   Technician:    Patient Complaints: This is a 75 year old man referred for evaluation of right hand paresthesias.  NCV & EMG Findings: Extensive electrodiagnostic testing of the right upper extremity shows:  1. Right median sensory and mixed palmar sensory responses are within normal limits.  Right ulnar sensory response shows prolonged latency (3.4 ms). 2. Right median motor responses within normal limits.  Of note, there is evidence of a right Martin-Gruber anastomosis, a normal anatomic variant.  Right ulnar motor response shows slowed conduction velocity across the elbow (A Elbow-B Elbow, 45 m/s).   3. Chronic motor axonal loss changes are seen affecting the right ulnar innervated muscles, without accompanied active denervation.    Impression: 1. Right ulnar neuropathy with slowing across the elbow, purely demyelinating, mild. 2. There is no evidence of a carpal tunnel syndrome or cervical radiculopathy affecting the right upper extremity.   ___________________________ Narda Amber, DO    Nerve Conduction Studies Anti Sensory Summary Table   Stim Site NR Peak (ms) Norm Peak (ms) P-T Amp (V) Norm P-T Amp  Right Median Anti Sensory (2nd Digit)  33C  Wrist    3.3 <3.8 24.1 >10  Right Ulnar Anti Sensory (5th Digit)  33C  Wrist    3.4 <3.2 24.0 >5   Motor Summary Table   Stim Site NR Onset (ms) Norm Onset (ms) O-P Amp (mV) Norm O-P Amp Site1 Site2 Delta-0 (ms) Dist (cm) Vel (m/s) Norm Vel (m/s)  Right Median Motor (Abd Poll Brev)  33C  Wrist    3.4 <4.0 5.2 >5 Elbow Wrist 4.9 28.0 57 >50  Elbow    8.3  4.6  Ulnar-wrist crossover Elbow 3.5 0.0    Ulnar-wrist crossover    4.8  4.9         Right  Ulnar Motor (Abd Dig Minimi)  33C  Wrist    2.6 <3.1 9.0 >7 B Elbow Wrist 3.5 23.0 66 >50  B Elbow    6.1  8.3  A Elbow B Elbow 2.2 10.0 45 >50  A Elbow    8.3  7.5          Comparison Summary Table   Stim Site NR Peak (ms) Norm Peak (ms) P-T Amp (V) Site1 Site2 Delta-P (ms) Norm Delta (ms)  Right Median/Ulnar Palm Comparison (Wrist - 8cm)  33C  Median Palm    1.7 <2.2 38.6 Median Palm Ulnar Palm 0.1   Ulnar Palm    1.8 <2.2 14.1       EMG   Side Muscle Ins Act Fibs Psw Fasc Number Recrt Dur Dur. Amp Amp. Poly Poly. Comment  Right 1stDorInt Nml Nml Nml Nml 1- Rapid Few 1+ Few 1+ Few 1+ N/A  Right ABD Dig Min Nml Nml Nml Nml 1- Rapid Few 1+ Few 1+ Few 1+ N/A  Right FlexCarpiUln Nml Nml Nml Nml 1- Rapid Few 1+ Few 1+ Few 1+ N/A  Right PronatorTeres Nml Nml Nml Nml Nml Nml Nml Nml Nml Nml Nml Nml N/A  Right Deltoid Nml Nml Nml Nml Nml Nml Nml Nml Nml Nml Nml Nml N/A  Right Biceps Nml Nml Nml Nml Nml Nml Nml Nml Nml Nml Nml Nml N/A  Right  Triceps Nml Nml Nml Nml Nml Nml Nml Nml Nml Nml Nml Nml N/A      Waveforms:

## 2021-01-19 DIAGNOSIS — I10 Essential (primary) hypertension: Secondary | ICD-10-CM | POA: Diagnosis not present

## 2021-01-19 DIAGNOSIS — G5621 Lesion of ulnar nerve, right upper limb: Secondary | ICD-10-CM | POA: Diagnosis not present

## 2021-01-19 DIAGNOSIS — E1169 Type 2 diabetes mellitus with other specified complication: Secondary | ICD-10-CM | POA: Diagnosis not present

## 2021-01-19 DIAGNOSIS — R202 Paresthesia of skin: Secondary | ICD-10-CM | POA: Diagnosis not present

## 2021-02-18 DIAGNOSIS — L509 Urticaria, unspecified: Secondary | ICD-10-CM | POA: Diagnosis not present

## 2021-08-10 DIAGNOSIS — J019 Acute sinusitis, unspecified: Secondary | ICD-10-CM | POA: Diagnosis not present

## 2021-08-17 ENCOUNTER — Other Ambulatory Visit: Payer: Self-pay | Admitting: Geriatric Medicine

## 2021-08-17 DIAGNOSIS — R1310 Dysphagia, unspecified: Secondary | ICD-10-CM

## 2021-08-17 DIAGNOSIS — I10 Essential (primary) hypertension: Secondary | ICD-10-CM | POA: Diagnosis not present

## 2021-08-17 DIAGNOSIS — R059 Cough, unspecified: Secondary | ICD-10-CM | POA: Diagnosis not present

## 2021-08-23 ENCOUNTER — Ambulatory Visit
Admission: RE | Admit: 2021-08-23 | Discharge: 2021-08-23 | Disposition: A | Discharge status: Home / Self Care | Payer: PPO | Source: Ambulatory Visit | Attending: Geriatric Medicine | Admitting: Geriatric Medicine

## 2021-08-23 DIAGNOSIS — K449 Diaphragmatic hernia without obstruction or gangrene: Secondary | ICD-10-CM | POA: Diagnosis not present

## 2021-08-23 DIAGNOSIS — K219 Gastro-esophageal reflux disease without esophagitis: Secondary | ICD-10-CM | POA: Diagnosis not present

## 2021-08-23 DIAGNOSIS — R1310 Dysphagia, unspecified: Secondary | ICD-10-CM | POA: Diagnosis not present

## 2021-08-31 DIAGNOSIS — Z0000 Encounter for general adult medical examination without abnormal findings: Secondary | ICD-10-CM | POA: Diagnosis not present

## 2021-08-31 DIAGNOSIS — Z79899 Other long term (current) drug therapy: Secondary | ICD-10-CM | POA: Diagnosis not present

## 2021-08-31 DIAGNOSIS — E7800 Pure hypercholesterolemia, unspecified: Secondary | ICD-10-CM | POA: Diagnosis not present

## 2021-08-31 DIAGNOSIS — I10 Essential (primary) hypertension: Secondary | ICD-10-CM | POA: Diagnosis not present

## 2021-08-31 DIAGNOSIS — K219 Gastro-esophageal reflux disease without esophagitis: Secondary | ICD-10-CM | POA: Diagnosis not present

## 2021-08-31 DIAGNOSIS — K9089 Other intestinal malabsorption: Secondary | ICD-10-CM | POA: Diagnosis not present

## 2021-08-31 DIAGNOSIS — E1169 Type 2 diabetes mellitus with other specified complication: Secondary | ICD-10-CM | POA: Diagnosis not present

## 2021-08-31 DIAGNOSIS — Z23 Encounter for immunization: Secondary | ICD-10-CM | POA: Diagnosis not present

## 2021-08-31 DIAGNOSIS — Z1389 Encounter for screening for other disorder: Secondary | ICD-10-CM | POA: Diagnosis not present

## 2021-09-08 DIAGNOSIS — Z961 Presence of intraocular lens: Secondary | ICD-10-CM | POA: Diagnosis not present

## 2021-10-15 DIAGNOSIS — K5901 Slow transit constipation: Secondary | ICD-10-CM | POA: Diagnosis not present

## 2021-11-29 DIAGNOSIS — R1310 Dysphagia, unspecified: Secondary | ICD-10-CM | POA: Diagnosis not present

## 2021-11-29 DIAGNOSIS — Z86010 Personal history of colonic polyps: Secondary | ICD-10-CM | POA: Diagnosis not present

## 2021-11-29 DIAGNOSIS — K219 Gastro-esophageal reflux disease without esophagitis: Secondary | ICD-10-CM | POA: Diagnosis not present

## 2022-01-04 DIAGNOSIS — K449 Diaphragmatic hernia without obstruction or gangrene: Secondary | ICD-10-CM | POA: Diagnosis not present

## 2022-01-04 DIAGNOSIS — R1314 Dysphagia, pharyngoesophageal phase: Secondary | ICD-10-CM | POA: Diagnosis not present

## 2022-01-04 DIAGNOSIS — K219 Gastro-esophageal reflux disease without esophagitis: Secondary | ICD-10-CM | POA: Diagnosis not present

## 2022-01-04 DIAGNOSIS — R0982 Postnasal drip: Secondary | ICD-10-CM | POA: Diagnosis not present

## 2022-01-04 DIAGNOSIS — J3489 Other specified disorders of nose and nasal sinuses: Secondary | ICD-10-CM | POA: Diagnosis not present

## 2022-01-04 DIAGNOSIS — J342 Deviated nasal septum: Secondary | ICD-10-CM | POA: Diagnosis not present

## 2022-01-04 DIAGNOSIS — Z87891 Personal history of nicotine dependence: Secondary | ICD-10-CM | POA: Diagnosis not present

## 2022-02-04 DIAGNOSIS — Z86010 Personal history of colonic polyps: Secondary | ICD-10-CM | POA: Diagnosis not present

## 2022-02-04 DIAGNOSIS — D128 Benign neoplasm of rectum: Secondary | ICD-10-CM | POA: Diagnosis not present

## 2022-02-04 DIAGNOSIS — K649 Unspecified hemorrhoids: Secondary | ICD-10-CM | POA: Diagnosis not present

## 2022-02-04 DIAGNOSIS — K449 Diaphragmatic hernia without obstruction or gangrene: Secondary | ICD-10-CM | POA: Diagnosis not present

## 2022-02-04 DIAGNOSIS — R1310 Dysphagia, unspecified: Secondary | ICD-10-CM | POA: Diagnosis not present

## 2022-02-08 DIAGNOSIS — D128 Benign neoplasm of rectum: Secondary | ICD-10-CM | POA: Diagnosis not present

## 2022-03-09 DIAGNOSIS — R1310 Dysphagia, unspecified: Secondary | ICD-10-CM | POA: Diagnosis not present

## 2022-03-09 DIAGNOSIS — E669 Obesity, unspecified: Secondary | ICD-10-CM | POA: Diagnosis not present

## 2022-03-09 DIAGNOSIS — I10 Essential (primary) hypertension: Secondary | ICD-10-CM | POA: Diagnosis not present

## 2022-04-04 ENCOUNTER — Inpatient Hospital Stay (HOSPITAL_COMMUNITY): Payer: PPO

## 2022-04-04 ENCOUNTER — Encounter (HOSPITAL_COMMUNITY): Payer: Self-pay

## 2022-04-04 ENCOUNTER — Emergency Department (HOSPITAL_COMMUNITY): Payer: PPO

## 2022-04-04 ENCOUNTER — Inpatient Hospital Stay (HOSPITAL_COMMUNITY)
Admission: EM | Admit: 2022-04-04 | Discharge: 2022-04-07 | DRG: 419 | Disposition: A | Discharge status: Home / Self Care | Payer: PPO | Attending: Internal Medicine | Admitting: Internal Medicine

## 2022-04-04 DIAGNOSIS — I10 Essential (primary) hypertension: Secondary | ICD-10-CM | POA: Diagnosis not present

## 2022-04-04 DIAGNOSIS — M25511 Pain in right shoulder: Secondary | ICD-10-CM | POA: Diagnosis not present

## 2022-04-04 DIAGNOSIS — R682 Dry mouth, unspecified: Secondary | ICD-10-CM | POA: Diagnosis not present

## 2022-04-04 DIAGNOSIS — Z8249 Family history of ischemic heart disease and other diseases of the circulatory system: Secondary | ICD-10-CM

## 2022-04-04 DIAGNOSIS — E669 Obesity, unspecified: Secondary | ICD-10-CM | POA: Diagnosis present

## 2022-04-04 DIAGNOSIS — R1011 Right upper quadrant pain: Secondary | ICD-10-CM | POA: Diagnosis not present

## 2022-04-04 DIAGNOSIS — Z86010 Personal history of colonic polyps: Secondary | ICD-10-CM | POA: Diagnosis not present

## 2022-04-04 DIAGNOSIS — Z79899 Other long term (current) drug therapy: Secondary | ICD-10-CM | POA: Diagnosis not present

## 2022-04-04 DIAGNOSIS — F419 Anxiety disorder, unspecified: Secondary | ICD-10-CM | POA: Diagnosis not present

## 2022-04-04 DIAGNOSIS — M797 Fibromyalgia: Secondary | ICD-10-CM | POA: Diagnosis present

## 2022-04-04 DIAGNOSIS — Z7902 Long term (current) use of antithrombotics/antiplatelets: Secondary | ICD-10-CM

## 2022-04-04 DIAGNOSIS — Z87891 Personal history of nicotine dependence: Secondary | ICD-10-CM | POA: Diagnosis not present

## 2022-04-04 DIAGNOSIS — R0789 Other chest pain: Secondary | ICD-10-CM | POA: Diagnosis not present

## 2022-04-04 DIAGNOSIS — Z83438 Family history of other disorder of lipoprotein metabolism and other lipidemia: Secondary | ICD-10-CM | POA: Diagnosis not present

## 2022-04-04 DIAGNOSIS — K219 Gastro-esophageal reflux disease without esophagitis: Secondary | ICD-10-CM | POA: Diagnosis present

## 2022-04-04 DIAGNOSIS — K529 Noninfective gastroenteritis and colitis, unspecified: Secondary | ICD-10-CM | POA: Diagnosis present

## 2022-04-04 DIAGNOSIS — K828 Other specified diseases of gallbladder: Secondary | ICD-10-CM | POA: Diagnosis not present

## 2022-04-04 DIAGNOSIS — M109 Gout, unspecified: Secondary | ICD-10-CM | POA: Diagnosis present

## 2022-04-04 DIAGNOSIS — K810 Acute cholecystitis: Secondary | ICD-10-CM | POA: Diagnosis not present

## 2022-04-04 DIAGNOSIS — E785 Hyperlipidemia, unspecified: Secondary | ICD-10-CM | POA: Diagnosis present

## 2022-04-04 DIAGNOSIS — R7303 Prediabetes: Secondary | ICD-10-CM | POA: Diagnosis present

## 2022-04-04 DIAGNOSIS — I2510 Atherosclerotic heart disease of native coronary artery without angina pectoris: Secondary | ICD-10-CM | POA: Diagnosis not present

## 2022-04-04 DIAGNOSIS — R4189 Other symptoms and signs involving cognitive functions and awareness: Secondary | ICD-10-CM | POA: Diagnosis present

## 2022-04-04 DIAGNOSIS — K8020 Calculus of gallbladder without cholecystitis without obstruction: Secondary | ICD-10-CM | POA: Diagnosis not present

## 2022-04-04 DIAGNOSIS — R7989 Other specified abnormal findings of blood chemistry: Secondary | ICD-10-CM | POA: Diagnosis present

## 2022-04-04 DIAGNOSIS — E876 Hypokalemia: Secondary | ICD-10-CM | POA: Diagnosis present

## 2022-04-04 DIAGNOSIS — G3184 Mild cognitive impairment, so stated: Secondary | ICD-10-CM | POA: Diagnosis present

## 2022-04-04 DIAGNOSIS — K8000 Calculus of gallbladder with acute cholecystitis without obstruction: Secondary | ICD-10-CM | POA: Diagnosis not present

## 2022-04-04 DIAGNOSIS — F03A0 Unspecified dementia, mild, without behavioral disturbance, psychotic disturbance, mood disturbance, and anxiety: Secondary | ICD-10-CM | POA: Diagnosis present

## 2022-04-04 DIAGNOSIS — Z955 Presence of coronary angioplasty implant and graft: Secondary | ICD-10-CM | POA: Diagnosis not present

## 2022-04-04 DIAGNOSIS — Z888 Allergy status to other drugs, medicaments and biological substances status: Secondary | ICD-10-CM | POA: Diagnosis not present

## 2022-04-04 DIAGNOSIS — Z9884 Bariatric surgery status: Secondary | ICD-10-CM

## 2022-04-04 DIAGNOSIS — R079 Chest pain, unspecified: Secondary | ICD-10-CM | POA: Diagnosis not present

## 2022-04-04 DIAGNOSIS — K82A1 Gangrene of gallbladder in cholecystitis: Secondary | ICD-10-CM | POA: Diagnosis present

## 2022-04-04 DIAGNOSIS — R1013 Epigastric pain: Secondary | ICD-10-CM | POA: Diagnosis not present

## 2022-04-04 LAB — HEPATIC FUNCTION PANEL
ALT: 13 U/L (ref 0–44)
AST: 20 U/L (ref 15–41)
Albumin: 3.6 g/dL (ref 3.5–5.0)
Alkaline Phosphatase: 72 U/L (ref 38–126)
Bilirubin, Direct: 0.3 mg/dL — ABNORMAL HIGH (ref 0.0–0.2)
Indirect Bilirubin: 1.4 mg/dL — ABNORMAL HIGH (ref 0.3–0.9)
Total Bilirubin: 1.7 mg/dL — ABNORMAL HIGH (ref 0.3–1.2)
Total Protein: 7.2 g/dL (ref 6.5–8.1)

## 2022-04-04 LAB — GLUCOSE, CAPILLARY
Glucose-Capillary: 140 mg/dL — ABNORMAL HIGH (ref 70–99)
Glucose-Capillary: 139 mg/dL — ABNORMAL HIGH (ref 70–99)

## 2022-04-04 LAB — CBC
HCT: 44.3 % (ref 39.0–52.0)
Hemoglobin: 15.4 g/dL (ref 13.0–17.0)
MCH: 29.7 pg (ref 26.0–34.0)
MCHC: 34.8 g/dL (ref 30.0–36.0)
MCV: 85.4 fL (ref 80.0–100.0)
Platelets: 274 10*3/uL (ref 150–400)
RBC: 5.19 MIL/uL (ref 4.22–5.81)
RDW: 15.4 % (ref 11.5–15.5)
WBC: 8.6 10*3/uL (ref 4.0–10.5)
nRBC: 0 % (ref 0.0–0.2)

## 2022-04-04 LAB — BASIC METABOLIC PANEL
Anion gap: 11 (ref 5–15)
BUN: 11 mg/dL (ref 8–23)
CO2: 21 mmol/L — ABNORMAL LOW (ref 22–32)
Calcium: 8.6 mg/dL — ABNORMAL LOW (ref 8.9–10.3)
Chloride: 103 mmol/L (ref 98–111)
Creatinine, Ser: 1.12 mg/dL (ref 0.61–1.24)
GFR, Estimated: >60 mL/min (ref >60)
Glucose, Bld: 176 mg/dL — ABNORMAL HIGH (ref 70–99)
Potassium: 3 mmol/L — ABNORMAL LOW (ref 3.5–5.1)
Sodium: 135 mmol/L (ref 135–145)

## 2022-04-04 LAB — TROPONIN I (HIGH SENSITIVITY)
Troponin I (High Sensitivity): 5 ng/L (ref <18)
Troponin I (High Sensitivity): 6 ng/L (ref <18)

## 2022-04-04 LAB — HEMOGLOBIN A1C
Hgb A1c MFr Bld: 6 % — ABNORMAL HIGH (ref 4.8–5.6)
Mean Plasma Glucose: 125.5 mg/dL

## 2022-04-04 LAB — LIPASE, BLOOD: Lipase: 23 U/L (ref 11–51)

## 2022-04-04 MED ORDER — POTASSIUM CHLORIDE CRYS ER 20 MEQ PO TBCR
40.0000 meq | EXTENDED_RELEASE_TABLET | Freq: Once | ORAL | Status: AC
  Administered 2022-04-04: 40 meq via ORAL
  Filled 2022-04-04: qty 2

## 2022-04-04 MED ORDER — BENAZEPRIL HCL 20 MG PO TABS
40.0000 mg | ORAL_TABLET | Freq: Every day | ORAL | Status: DC
Start: 2022-04-04 — End: 2022-04-05
  Filled 2022-04-04: qty 1
  Filled 2022-04-04 (×2): qty 2

## 2022-04-04 MED ORDER — TECHNETIUM TC 99M MEBROFENIN IV KIT
5.0000 | PACK | Freq: Once | INTRAVENOUS | Status: AC | PRN
  Administered 2022-04-04: 5 via INTRAVENOUS

## 2022-04-04 MED ORDER — AMLODIPINE BESYLATE 2.5 MG PO TABS
2.5000 mg | ORAL_TABLET | Freq: Every day | ORAL | Status: DC
  Administered 2022-04-04: 2.5 mg via ORAL
  Filled 2022-04-04: qty 1

## 2022-04-04 MED ORDER — HYDRALAZINE HCL 20 MG/ML IJ SOLN: 10.0000 mg | INTRAMUSCULAR | Status: DC | PRN

## 2022-04-04 MED ORDER — MORPHINE SULFATE (PF) 4 MG/ML IV SOLN
3.0000 mg | Freq: Once | INTRAVENOUS | Status: AC
  Administered 2022-04-04: 3 mg via INTRAVENOUS
  Filled 2022-04-04: qty 1

## 2022-04-04 MED ORDER — ONDANSETRON HCL 4 MG/2ML IJ SOLN: 4.0000 mg | Freq: Four times a day (QID) | INTRAMUSCULAR | Status: DC | PRN

## 2022-04-04 MED ORDER — PANTOPRAZOLE SODIUM 40 MG IV SOLR
40.0000 mg | Freq: Once | INTRAVENOUS | Status: AC
  Administered 2022-04-04: 40 mg via INTRAVENOUS
  Filled 2022-04-04: qty 10

## 2022-04-04 MED ORDER — IOHEXOL 300 MG/ML  SOLN
100.0000 mL | Freq: Once | INTRAMUSCULAR | Status: AC | PRN
  Administered 2022-04-04: 100 mL via INTRAVENOUS

## 2022-04-04 MED ORDER — PIPERACILLIN-TAZOBACTAM 3.375 G IVPB 30 MIN
3.3750 g | Freq: Once | INTRAVENOUS | Status: AC
Start: 2022-04-04 — End: 2022-04-04
  Administered 2022-04-04: 3.375 g via INTRAVENOUS
  Filled 2022-04-04: qty 50

## 2022-04-04 MED ORDER — EZETIMIBE 10 MG PO TABS
10.0000 mg | ORAL_TABLET | Freq: Every day | ORAL | Status: DC
Start: 2022-04-04 — End: 2022-04-05
  Administered 2022-04-04: 10 mg via ORAL
  Filled 2022-04-04: qty 1

## 2022-04-04 MED ORDER — METOPROLOL TARTRATE 50 MG PO TABS
75.0000 mg | ORAL_TABLET | Freq: Two times a day (BID) | ORAL | Status: DC
  Administered 2022-04-04 (×2): 75 mg via ORAL
  Filled 2022-04-04: qty 3
  Filled 2022-04-04: qty 1

## 2022-04-04 MED ORDER — FLUTICASONE PROPIONATE 50 MCG/ACT NA SUSP
2.0000 | Freq: Every day | NASAL | Status: DC
  Administered 2022-04-04 – 2022-04-07 (×3): 2 via NASAL
  Filled 2022-04-04: qty 16

## 2022-04-04 MED ORDER — SODIUM CHLORIDE 0.9 % IV SOLN
2.0000 g | Freq: Once | INTRAVENOUS | Status: AC
  Administered 2022-04-04: 2 g via INTRAVENOUS
  Filled 2022-04-04: qty 20

## 2022-04-04 MED ORDER — LACTATED RINGERS IV SOLN: INTRAVENOUS | Status: DC

## 2022-04-04 MED ORDER — OXYCODONE HCL 5 MG PO TABS: 5.0000 mg | ORAL_TABLET | ORAL | Status: DC | PRN

## 2022-04-04 MED ORDER — MORPHINE SULFATE (PF) 2 MG/ML IV SOLN
2.0000 mg | INTRAVENOUS | Status: DC | PRN
  Filled 2022-04-04: qty 1

## 2022-04-04 MED ORDER — ONDANSETRON HCL 4 MG/2ML IJ SOLN
4.0000 mg | Freq: Once | INTRAMUSCULAR | Status: AC
Start: 2022-04-04 — End: 2022-04-04
  Administered 2022-04-04: 4 mg via INTRAVENOUS
  Filled 2022-04-04: qty 2

## 2022-04-04 MED ORDER — ACETAMINOPHEN 325 MG PO TABS
650.0000 mg | ORAL_TABLET | Freq: Four times a day (QID) | ORAL | Status: DC | PRN
  Administered 2022-04-05: 650 mg via ORAL
  Filled 2022-04-04: qty 2

## 2022-04-04 MED ORDER — INSULIN ASPART 100 UNIT/ML IJ SOLN
0.0000 [IU] | Freq: Three times a day (TID) | INTRAMUSCULAR | Status: DC
  Administered 2022-04-06 (×2): 3 [IU] via SUBCUTANEOUS
  Administered 2022-04-06 – 2022-04-07 (×2): 2 [IU] via SUBCUTANEOUS

## 2022-04-04 MED ORDER — SODIUM CHLORIDE 0.9 % IV BOLUS
1000.0000 mL | Freq: Once | INTRAVENOUS | Status: AC
  Administered 2022-04-04: 1000 mL via INTRAVENOUS

## 2022-04-04 MED ORDER — POTASSIUM CHLORIDE 10 MEQ/100ML IV SOLN
10.0000 meq | Freq: Once | INTRAVENOUS | Status: AC
  Administered 2022-04-04: 10 meq via INTRAVENOUS
  Filled 2022-04-04: qty 100

## 2022-04-04 MED ORDER — MELATONIN 5 MG PO TABS
5.0000 mg | ORAL_TABLET | Freq: Every day | ORAL | Status: DC
  Administered 2022-04-04 – 2022-04-06 (×3): 5 mg via ORAL
  Filled 2022-04-04 (×3): qty 1

## 2022-04-04 MED ORDER — PIPERACILLIN-TAZOBACTAM 3.375 G IVPB
3.3750 g | Freq: Three times a day (TID) | INTRAVENOUS | Status: DC
  Administered 2022-04-04 – 2022-04-05 (×3): 3.375 g via INTRAVENOUS
  Filled 2022-04-04 (×3): qty 50

## 2022-04-04 MED ORDER — ONDANSETRON 4 MG PO TBDP: 4.0000 mg | ORAL_TABLET | Freq: Four times a day (QID) | ORAL | Status: DC | PRN

## 2022-04-04 MED ORDER — ACETAMINOPHEN 650 MG RE SUPP: 650.0000 mg | Freq: Four times a day (QID) | RECTAL | Status: DC | PRN

## 2022-04-04 NOTE — ED Provider Notes (Signed)
General surgery made aware of case.  They will consult.  Hospitalist service made aware of case.  They will evaluate for admission.    Valarie Merino, MD 04/04/22 [phone]

## 2022-04-04 NOTE — ED Notes (Signed)
Patient transported to CT 

## 2022-04-04 NOTE — ED Provider Notes (Signed)
[facility] Provider Note  CSN: [id number] Arrival date & time: 04/04/22 0200  Chief Complaint(s) Chest Pain  HPI Arthur Shaw is a 76 y.o. male with a past medical history listed below including hypertension, hyperlipidemia, CAD status post 1 stent, gastric bypass surgery who presents to the emergency department for 1 week of diarrhea treated with Imodium.  Patient has been having intermittent abdominal and back cramping.  Reports that he has not had good bowel movement today stating he has only had a few small pellets.  He is endorsing nausea without emesis.  Additionally he is endorsing several days of intermittent left chest discomfort described as burning lasting few minutes at a time.  These are nonexertional and nonradiating.  Self resolved or improved after taking Tums.  No associated shortness of breath.  Patient and wife deny any recent fevers or infections.  No coughing or congestion.  No known suspicious food intake, recent antibiotic use or travel.  The history is provided by the patient and the spouse.    Past Medical History Past Medical History:  Diagnosis Date   Adenomatous colon polyp    4/06 FS--[facility]; 9/07 colon: [facility] sm aden p   ASCVD (arteriosclerotic cardiovascular disease)    single vessel, s/p DE stent to mid LAD 4/06   Essential hypertension, benign    Fibromyalgia    questionable   Former smoker, stopped smoking in distant past    GERD (gastroesophageal reflux disease)    UGI 09/2012   Gout    Hyperlipidemia    Macular hole    at [facility]   Obesity    Prediabetes    Statin intolerance    Patient Active Problem List   Diagnosis Date Noted   Allergic conjunctivitis and rhinitis 06/13/2015   Eustachian tube dysfunction 06/13/2015   Elevated blood pressure 02/06/2015   Stress disorder, acute 02/06/2015   Obesity 05/02/2014   Coronary atherosclerosis of native coronary artery 04/24/2014   Hypertension     Essential hypertension, benign    Hyperlipidemia    Home Medication(s) Prior to Admission medications   Medication Sig Start Date End Date Taking? Authorizing Provider  amLODipine-benazepril (LOTREL) 10-20 MG capsule 1 capsule.    [provider]  azelastine (ASTELIN) 0.1 % nasal spray 1 spray as needed.    [provider]  benazepril (LOTENSIN) 20 MG tablet Take 20 mg by mouth daily.    [provider]  Calcium Carb-Ergocalciferol 250-125 MG-UNIT TABS 1 tablet 3 times daily.    [provider]  cetirizine (ZYRTEC) 10 MG tablet 10 mg daily.    [provider]  clopidogrel (PLAVIX) 75 MG tablet Take 75 mg by mouth daily.    [provider]  cyanocobalamin 1000 MCG tablet Take 1,000 mcg by mouth daily.    [provider]  cyclobenzaprine (FLEXERIL) 10 MG tablet Take 10 mg by mouth 3 (three) times daily as needed.    [provider]  docusate sodium (STOOL SOFTENER) 100 MG capsule 200 mg.    [provider]  eszopiclone (LUNESTA) 2 MG TABS tablet 1 mg.    [provider]  ezetimibe (ZETIA) 10 MG tablet Take 10 mg by mouth daily.    [provider]  hydrochlorothiazide (HYDRODIURIL) 12.5 MG tablet  11/07/17   [provider]  metoprolol (LOPRESSOR) 50 MG tablet Take 50 mg by mouth 2 (two) times daily.    [provider]  mometasone (NASONEX) 50 MCG/ACT nasal  spray Place 2 sprays into the nose daily. Patient not taking: Reported on 04/04/2018 11/22/17   Jiles Prows, MD  montelukast (SINGULAIR) 10 MG tablet TAKE ONE TABLET BY MOUTH DAILY 09/24/18   Kozlow, Donnamarie Poag, MD  Multiple Vitamin (MULTIVITAMIN) tablet Take 1 tablet by mouth daily.    [provider]  nitroGLYCERIN (NITROSTAT) 0.4 MG SL tablet Place 1 tablet (0.4 mg total) under the tongue every 5 (five) minutes as needed for chest pain. 05/02/14   Jettie Booze, MD  Olopatadine HCl 0.6 % SOLN USE 1-2 SPRAYS IN EACH  NOSTRIL 1-2 TIMES DAILY AS NEEDED FOR RUNNY NOSE OR DRAINAGE 11/22/17   Kozlow, Donnamarie Poag, MD  pantoprazole (PROTONIX) 40 MG tablet  11/06/17   [provider]  rosuvastatin (CRESTOR) 10 MG tablet Take 1 tablet (10 mg total) by mouth daily. 02/19/16   Jettie Booze, MD  sildenafil (VIAGRA) 50 MG tablet Take 50 mg by mouth.    [provider]  tamsulosin (FLOMAX) 0.4 MG CAPS capsule  07/11/16   [provider]  topiramate (TOPAMAX) 50 MG tablet Take by mouth. 09/22/16   [provider]  ZETONNA 36 MCG/ACT AERS SPRAY ONE SPRAY IN EACH NOSTRIL ONCE DAILY 02/24/17   Kozlow, Donnamarie Poag, MD                                                                                                                                    Allergies Statins and Welchol [colesevelam hcl]  Review of Systems Review of Systems As noted in HPI  Physical Exam Vital Signs  I have reviewed the triage vital signs BP 137/65   Pulse 75   Temp 98.2 F (36.8 C)   Resp (!) 22   SpO2 91%   Physical Exam Vitals reviewed.  Constitutional:      General: He is not in acute distress.    Appearance: He is well-developed. He is not diaphoretic.  HENT:     Head: Normocephalic and atraumatic.     Right Ear: External ear normal.     Left Ear: External ear normal.     Nose: Nose normal.     Mouth/Throat:     Mouth: Mucous membranes are moist.  Eyes:     General: No scleral icterus.    Conjunctiva/sclera: Conjunctivae normal.  Neck:     Trachea: Phonation normal.  Cardiovascular:     Rate and Rhythm: Normal rate and regular rhythm.  Pulmonary:     Effort: Pulmonary effort is normal. No respiratory distress.     Breath sounds: No stridor.  Abdominal:     General: There is no distension.     Tenderness: There is abdominal tenderness in the epigastric area and left upper quadrant. There is no right CVA tenderness, left CVA tenderness, guarding or rebound.  Musculoskeletal:        General:  Normal range of motion.  Cervical back: Normal range of motion.  Neurological:     Mental Status: He is alert and oriented to person, place, and time.  Psychiatric:        Behavior: Behavior normal.     ED Results and Treatments Labs (all labs ordered are listed, but only abnormal results are displayed) Labs Reviewed  BASIC METABOLIC PANEL - Abnormal; Notable for the following components:      Result Value   Potassium 3.0 (*)    CO2 21 (*)    Glucose, Bld 176 (*)    Calcium 8.6 (*)    All other components within normal limits  HEPATIC FUNCTION PANEL - Abnormal; Notable for the following components:   Total Bilirubin 1.7 (*)    Bilirubin, Direct 0.3 (*)    Indirect Bilirubin 1.4 (*)    All other components within normal limits  CBC  LIPASE, BLOOD  TROPONIN I (HIGH SENSITIVITY)  TROPONIN I (HIGH SENSITIVITY)                                                                                                                         EKG  EKG Interpretation  Date/Time:  Monday April 04 2022 02:15:04 EDT Ventricular Rate:  63 PR Interval:  206 QRS Duration: 90 QT Interval:  450 QTC Calculation: 460 R Axis:   3 Text Interpretation: Normal sinus rhythm When compared with ECG of 06-Dec-2011 02:20, PREVIOUS ECG IS PRESENT Since last tracing rate slower Otherwise no significant change Confirmed by Addison Lank [phone]) on 04/04/2022 3:09:53 AM       Radiology CT ABDOMEN PELVIS W CONTRAST  Result Date: 04/04/2022 CLINICAL DATA:  Abdominal pain. EXAM: CT ABDOMEN AND PELVIS WITH CONTRAST TECHNIQUE: Multidetector CT imaging of the abdomen and pelvis was performed using the standard protocol following bolus administration of intravenous contrast. RADIATION DOSE REDUCTION: This exam was performed according to the departmental dose-optimization program which includes automated exposure control, adjustment of the mA and/or kV according to patient size and/or use of iterative reconstruction  technique. CONTRAST:  154m OMNIPAQUE IOHEXOL 300 MG/ML  SOLN COMPARISON:  None Available. FINDINGS: Lower chest: No acute abnormality. There is marked asymmetric elevation of the right hemidiaphragm Hepatobiliary: No suspicious liver lesion identified. Stones identified within the gallbladder which measure up to 1.6 cm. The gallbladder appears distended and there is mild pericholecystic edema. Cannot exclude cholecystitis. No bile duct dilatation. Pancreas: Unremarkable. No pancreatic ductal dilatation or surrounding inflammatory changes. Spleen: Normal in size without focal abnormality. Adrenals/Urinary Tract: Normal right adrenal gland. Benign left adrenal gland myelolipoma measures 3.1 x 2.8 cm, image 31/3. This is compatible with a benign abnormality. No follow-up imaging is recommended. Benign appearing Bosniak class 1 left kidney cyst arises off the posterior cortex of the upper pole measuring 3.7 cm, image 42/3. No follow-up imaging recommended. No nephrolithiasis or hydronephrosis identified bilaterally. Urinary bladder is within normal limits. Stomach/Bowel: Postoperative changes are identified compatible with previous sleeve gastrectomy. Small hiatal hernia is noted.  No small bowel wall thickening, inflammation or distension. The appendix is visualized and appears normal, image 52/7. There is mild pericolonic haziness and equivocal wall thickening involving the ascending colon noted. Decompressed sigmoid colon and rectum identified with mild wall thickening, nonspecific. No signs pneumatosis. No evidence to suggest bowel obstruction. Vascular/Lymphatic: Aortic atherosclerosis. No aneurysm. No signs of abdominopelvic adenopathy. Reproductive: Mild prostate gland enlargement. Other: No free fluid or fluid collections. No signs of pneumoperitoneum. Musculoskeletal: Thoracolumbar spondylosis. No acute or suspicious osseous findings. IMPRESSION: 1. Cholelithiasis with distended gallbladder and mild  pericholecystic edema. Cannot exclude cholecystitis. Consider further evaluation with right upper quadrant ultrasound. 2. Mild pericolonic haziness and equivocal wall thickening involving the ascending colon and sigmoid colon and rectum. Recommend correlation for any clinical signs or symptoms suggestive of colitis. 3. Normal appendix. 4. Marked asymmetric elevation of the right hemidiaphragm. 5. Aortic Atherosclerosis (ICD10-I70.0). Electronically Signed   By: Kerby Moors M.D.   On: 04/04/2022 05:49   DG Chest 2 View  Result Date: 04/04/2022 CLINICAL DATA:  Chest pain for 2 days EXAM: CHEST - 2 VIEW COMPARISON:  12/06/2011 FINDINGS: Cardiac shadow is within normal limits. The lungs are well aerated bilaterally. No focal infiltrate or effusion is seen. Mild scarring is noted in the bases bilaterally. Degenerative changes of the thoracic spine are noted. IMPRESSION: No active cardiopulmonary disease. Electronically Signed   By: Inez Catalina M.D.   On: 04/04/2022 02:36    Pertinent labs & imaging results that were available during my care of the patient were reviewed by me and considered in my medical decision making (see MDM for details).  Medications Ordered in ED Medications  sodium chloride 0.9 % bolus 1,000 mL (1,000 mLs Intravenous New Bag/Given 04/04/22 0419)  pantoprazole (PROTONIX) injection 40 mg (40 mg Intravenous Given 04/04/22 0420)  iohexol (OMNIPAQUE) 300 MG/ML solution 100 mL (100 mLs Intravenous Contrast Given 04/04/22 0510)  ondansetron (ZOFRAN) injection 4 mg (4 mg Intravenous Given 04/04/22 0533)                                                                                                                                     Procedures Procedures  (including critical care time)  Medical Decision Making / ED Course    Complexity of Problem:  Co-morbidities/SDOH that complicate the patient evaluation/care: Noted above in HPI  Patient's presenting problem/concern, DDX, and MDM  listed below: Abdominal discomfort Mostly upper abdominal tenderness to palpation We will obtain screening labs to assess for any evidence of pancreatitis, biliary disease, or other intra-abdominal inflammatory/infectious process Patient's chest pain is highly atypical for ACS and feel this is likely GI related but given his past medical history we will obtain cardiac work-up. I have low suspicion for PE, dissection, or esophageal perforation  Hospitalization Considered:  Yes  Initial Intervention:  IV fluids and antiemetic    Complexity of Data:   Cardiac Monitoring: The patient was  maintained on a cardiac monitor.   I personally viewed and interpreted the cardiac monitored which showed an underlying rhythm of normal sinus rhythm with rates in the 70s to 80s EKG without acute ischemic changes or evidence of pericarditis  Laboratory Tests ordered listed below with my independent interpretation: CBC without leukocytosis or anemia Patient has hypokalemia likely due to GI losses.  Mild hyperglycemia without evidence of DKA no other significant electrolyte derangements or renal insufficiency Patient has mildly elevated bili, no other signs of biliary obstruction or pancreatitis. Initial troponin negative.  Delta troponin negative   Imaging Studies ordered listed below with my independent interpretation: Chest x-ray without evidence of pneumonia, pneumothorax, pulmonary edema or pleural effusions CT of the abdomen without evidence of obstruction or obvious intestinal inflammatory/infectious process.  Patient does have a distended gallbladder with multiple gallstones. Radiology did see evidence of mild colitis. Agreed with the cholelithiasis and also noted pericholecystic fluid RUQ [place] ordered given the cholelithiasis, pending     ED Course:    Assessment, Add'l Intervention, and Reassessment: Abd pain Patient presentation is consistent with mild colitis which appears to be improving  based on symptoms Work-up also notable for cholelithiasis and distended gallbladder, currently awaiting right upper quadrant ultrasound. Given the atypical nature of the patient's chest discomfort that is more consistent with GERD, I feel his cardiac work up thus far is sufficient to rule out ACS.  Patient care turned over to oncoming provider. Patient case and results discussed in detail; please see their note for further ED managment.     Final Clinical Impression(s) / ED Diagnoses Final diagnoses:  None           This chart was dictated using voice recognition software.  Despite best efforts to proofread,  errors can occur which can change the documentation meaning.    Fatima Blank, MD 04/04/22 0700

## 2022-04-04 NOTE — H&P (Signed)
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 male with medical history significant of CAD s/p stent; HTN; HLD; prediabetes; and obesity s/p gastric bypass presenting with chest pain.  He reports that he was having some mucus and other things.  He as hoping for a medicine to get the mucus down.  He felt like stuff was getting stuck, having to crush pills to swallow them.  He can't think of any pain that he has had.  No fever.  Occasional nausea.  He is having dry mouth.  Occasionally forgets a medicine but his wife helps with that.    ER Course:  Acute cholecystitis.  Surgery prefers medicine admission due to age and comorbidities.     Review of Systems: As mentioned in the history of present illness. All other systems reviewed and are negative. Past Medical History:  Diagnosis Date   Adenomatous colon polyp    4/06 FS--[facility]; 9/07 colon: [facility] sm aden p   ASCVD (arteriosclerotic cardiovascular disease)    single vessel, s/p DE stent to mid LAD 4/06   Essential hypertension, benign    Fibromyalgia    questionable   Former smoker, stopped smoking in distant past    GERD (gastroesophageal reflux disease)    UGI 09/2012   Gout    Hyperlipidemia    Macular hole    at [facility]   Obesity    Prediabetes    Statin intolerance    Past Surgical History:  Procedure Laterality Date   CORONARY STENT PLACEMENT     taxus stent placed-Dr. Ilda Foil LAS 01/2005   STOMACH SURGERY     bariatric surgery 05/19/13   TONSILLECTOMY     Social History:  reports that he has never smoked. He has never used smokeless tobacco. He reports current alcohol use. He reports that he does not use drugs.  Allergies  Allergen Reactions    Statins     Other reaction(s): Other (See Comments) memory fatigue   Welchol [Colesevelam Hcl]     Muscle aches    Family History  Problem Relation Age of Onset   Dementia Mother    Arrhythmia Father        afib   Heart failure Father    Fibromyalgia Sister    Hyperlipidemia Brother    CAD Brother    Drug abuse Son     Prior to Admission medications   Medication Sig Start Date End Date Taking? Authorizing Provider  amLODipine-benazepril (LOTREL) 10-20 MG capsule 1 capsule.    [provider]  azelastine (ASTELIN) 0.1 % nasal spray 1 spray as needed.    [provider]  benazepril (LOTENSIN) 20 MG tablet Take 20 mg by mouth daily.    [provider]  Calcium Carb-Ergocalciferol 250-125 MG-UNIT TABS 1 tablet 3 times daily.    [provider]  cetirizine (ZYRTEC) 10 MG tablet 10 mg daily.    [provider]  clopidogrel (PLAVIX) 75 MG tablet Take 75 mg by mouth daily.    [provider]  cyanocobalamin 1000 MCG tablet Take 1,000 mcg by mouth daily.    [provider]  cyclobenzaprine (FLEXERIL) 10 MG tablet Take 10 mg by mouth 3 (three) times daily  as needed.    [provider]  docusate sodium (STOOL SOFTENER) 100 MG capsule 200 mg.    [provider]  eszopiclone (LUNESTA) 2 MG TABS tablet 1 mg.    [provider]  ezetimibe (ZETIA) 10 MG tablet Take 10 mg by mouth daily.    [provider]  hydrochlorothiazide (HYDRODIURIL) 12.5 MG tablet  11/07/17   [provider]  metoprolol (LOPRESSOR) 50 MG tablet Take 50 mg by mouth 2 (two) times daily.    [provider]  mometasone (NASONEX) 50 MCG/ACT nasal spray Place 2 sprays into the nose daily. Patient not taking: Reported on 04/04/2018 11/22/17   Jiles Prows, MD  montelukast (SINGULAIR) 10 MG tablet TAKE ONE TABLET BY MOUTH DAILY 09/24/18   Kozlow, Donnamarie Poag, MD  Multiple Vitamin (MULTIVITAMIN) tablet Take 1 tablet by  mouth daily.    [provider]  nitroGLYCERIN (NITROSTAT) 0.4 MG SL tablet Place 1 tablet (0.4 mg total) under the tongue every 5 (five) minutes as needed for chest pain. 05/02/14   Jettie Booze, MD  Olopatadine HCl 0.6 % SOLN USE 1-2 SPRAYS IN EACH NOSTRIL 1-2 TIMES DAILY AS NEEDED FOR RUNNY NOSE OR DRAINAGE 11/22/17   Kozlow, Donnamarie Poag, MD  pantoprazole (PROTONIX) 40 MG tablet  11/06/17   [provider]  rosuvastatin (CRESTOR) 10 MG tablet Take 1 tablet (10 mg total) by mouth daily. 02/19/16   Jettie Booze, MD  sildenafil (VIAGRA) 50 MG tablet Take 50 mg by mouth.    [provider]  tamsulosin (FLOMAX) 0.4 MG CAPS capsule  07/11/16   [provider]  topiramate (TOPAMAX) 50 MG tablet Take by mouth. 09/22/16   [provider]  ZETONNA 37 MCG/ACT AERS SPRAY ONE SPRAY IN EACH NOSTRIL ONCE DAILY 02/24/17   Jiles Prows, MD    Physical Exam: Vitals:   04/04/22 0545 04/04/22 0600 04/04/22 0615 04/04/22 0700  BP: 136/65 131/62 137/65 138/63  Pulse: 76 74 75 76  Resp: 18 (!) 21 (!) 22 10  Temp:      SpO2: 95% 92% 91% 94%   General:  Appears calm and comfortable and is in NAD, mildly confused and generally poor historian Eyes:  PERRL, EOMI, normal lids, iris ENT:  grossly normal hearing, lips & tongue, mildly dry mm; artificial dentition Neck:  no LAD, masses or thyromegaly Cardiovascular:  RRR, no m/r/g. No LE edema.  Respiratory:   CTA bilaterally with no wheezes/rales/rhonchi.  Normal respiratory effort. Abdomen:  soft, RUQ and midepigastric TTP, ND Skin:  no rash or induration seen on limited exam Musculoskeletal:  grossly normal tone BUE/BLE, good ROM, no bony abnormality Psychiatric:  grossly normal mood and affect with some apparent confusion, speech fluent and appropriate Neurologic:  CN 2-12 grossly intact, moves all extremities in coordinated fashion   Radiological Exams on Admission: Independently reviewed - see discussion  in A/P where applicable  US Abdomen Limited RUQ (LIVER/GB)  Result Date: 04/04/2022 CLINICAL DATA:  76 year old male with epigastric abdominal pain. EXAM: ULTRASOUND ABDOMEN LIMITED RIGHT UPPER QUADRANT COMPARISON:  CT Abdomen and Pelvis 0459 hours today. FINDINGS: Gallbladder: Distended gallbladder. Cholelithiasis better demonstrated by CT. Gallbladder wall thickening of 5-6 mm on image 3. Positive sonographic Murphy sign. No pericholecystic fluid identified. Common bile duct: Diameter: Unable to visualize by ultrasound. CBD was up to 6-7 mm on the earlier CT, somewhat tapered distally on that exam. Liver: Suboptimally visualized. Background echogenicity within normal limits (image 36). No  discrete liver lesion identified. Portal vein is patent on color Doppler imaging with normal direction of blood flow towards the liver. Other: No free fluid.  Negative visible right kidney. IMPRESSION: 1. Acute Cholecystitis: distended gallbladder with wall thickening and positive sonographic Murphy sign. Cholelithiasis better demonstrated by CT today. 2. CBD poorly visualized by ultrasound. Electronically Signed   By: Genevie Ann M.D.   On: 04/04/2022 07:13   CT ABDOMEN PELVIS W CONTRAST  Result Date: 04/04/2022 CLINICAL DATA:  Abdominal pain. EXAM: CT ABDOMEN AND PELVIS WITH CONTRAST TECHNIQUE: Multidetector CT imaging of the abdomen and pelvis was performed using the standard protocol following bolus administration of intravenous contrast. RADIATION DOSE REDUCTION: This exam was performed according to the departmental dose-optimization program which includes automated exposure control, adjustment of the mA and/or kV according to patient size and/or use of iterative reconstruction technique. CONTRAST:  164m OMNIPAQUE IOHEXOL 300 MG/ML  SOLN COMPARISON:  None Available. FINDINGS: Lower chest: No acute abnormality. There is marked asymmetric elevation of the right hemidiaphragm Hepatobiliary: No suspicious liver lesion  identified. Stones identified within the gallbladder which measure up to 1.6 cm. The gallbladder appears distended and there is mild pericholecystic edema. Cannot exclude cholecystitis. No bile duct dilatation. Pancreas: Unremarkable. No pancreatic ductal dilatation or surrounding inflammatory changes. Spleen: Normal in size without focal abnormality. Adrenals/Urinary Tract: Normal right adrenal gland. Benign left adrenal gland myelolipoma measures 3.1 x 2.8 cm, image 31/3. This is compatible with a benign abnormality. No follow-up imaging is recommended. Benign appearing Bosniak class 1 left kidney cyst arises off the posterior cortex of the upper pole measuring 3.7 cm, image 42/3. No follow-up imaging recommended. No nephrolithiasis or hydronephrosis identified bilaterally. Urinary bladder is within normal limits. Stomach/Bowel: Postoperative changes are identified compatible with previous sleeve gastrectomy. Small hiatal hernia is noted. No small bowel wall thickening, inflammation or distension. The appendix is visualized and appears normal, image 52/7. There is mild pericolonic haziness and equivocal wall thickening involving the ascending colon noted. Decompressed sigmoid colon and rectum identified with mild wall thickening, nonspecific. No signs pneumatosis. No evidence to suggest bowel obstruction. Vascular/Lymphatic: Aortic atherosclerosis. No aneurysm. No signs of abdominopelvic adenopathy. Reproductive: Mild prostate gland enlargement. Other: No free fluid or fluid collections. No signs of pneumoperitoneum. Musculoskeletal: Thoracolumbar spondylosis. No acute or suspicious osseous findings. IMPRESSION: 1. Cholelithiasis with distended gallbladder and mild pericholecystic edema. Cannot exclude cholecystitis. Consider further evaluation with right upper quadrant ultrasound. 2. Mild pericolonic haziness and equivocal wall thickening involving the ascending colon and sigmoid colon and rectum. Recommend  correlation for any clinical signs or symptoms suggestive of colitis. 3. Normal appendix. 4. Marked asymmetric elevation of the right hemidiaphragm. 5. Aortic Atherosclerosis (ICD10-I70.0). Electronically Signed   By: TKerby MoorsM.D.   On: 04/04/2022 05:49   DG Chest 2 View  Result Date: 04/04/2022 CLINICAL DATA:  Chest pain for 2 days EXAM: CHEST - 2 VIEW COMPARISON:  12/06/2011 FINDINGS: Cardiac shadow is within normal limits. The lungs are well aerated bilaterally. No focal infiltrate or effusion is seen. Mild scarring is noted in the bases bilaterally. Degenerative changes of the thoracic spine are noted. IMPRESSION: No active cardiopulmonary disease. Electronically Signed   By: MInez CatalinaM.D.   On: 04/04/2022 02:36    EKG: Independently reviewed.  NSR with rate 63; no evidence of acute ischemia   Labs on Admission: I have personally reviewed the available labs and imaging studies at the time of the admission.  Pertinent labs:   K+  3.0 Glucose 176 Bili 1.7 HS troponin 5, 6 Normal CBC    Assessment and Plan: Principal Problem:   Acute cholecystitis due to biliary calculus Active Problems:   Hypertension   Hyperlipidemia   Coronary atherosclerosis of native coronary artery   Obesity    Acute cholecystitis -Patient with vague history; history previously provided by his wife, who was not present at the time of my evaluation -He reported to the EDP that he had diarrhea for a week, treated with Imodium; also with intermittent abdominal and back pain -Labs unremarkable with slightly elevated bili -Abdominal CT with cholelithiasis, distended gallbladder, and pericholecystic edema; also ?colitis -RUS [place] with acute cholecystitis -Surgery is consulting and planning to do HIDA scan, but patient appears to have acute cholecystitis at this time -Will admit to med surg -NPO for now -Morphine for pain, Zofran for nausea -Empiric coverage with Zosyn for now   CAD -s/p stent very  remotely -No current c/o CP but patient is poor historian -Negative troponin x 2 -Hold Plavix pending possible need for surgery  HTN -Continue home meds - amlodipine, benazepril, metoprolol  HLD -Hold Crestor - he takes it once weekly so probably not getting significant effect anyway  Mild cognitive impairment -Based on current evaluation, patient appears to have mild dementia -His wife was not present and was not available by telephone at the time of my evaluation -Will need ongoing monitoring -Will order delirium precautions  Hypokalemia -He was given 10 mEq KCl IV in the ER -Will give an additional 40 mEq PO now -Recheck CMP in AM  Prediabetes -No A1c is available at this time -Glucose is significantly elevated - stress response vs. DM -Will cover with moderate-scale SSI for now  Obesity -s/p gastric bypass -Current weight is pending -Ongoing weight loss efforts should be encouraged, as obesity is a negative predictor regarding morbidity/mortality     Advance Care Planning:   Code Status: Full Code   Consults: Surgery  DVT Prophylaxis: SCDs  Family Communication: None present; wife was here prior to my evaluation but was not available in person or by telephone at the time of my evaluation  Severity of Illness: The appropriate patient status for this patient is INPATIENT. Inpatient status is judged to be reasonable and necessary in order to provide the required intensity of service to ensure the patient's safety. The patient's presenting symptoms, physical exam findings, and initial radiographic and laboratory data in the context of their chronic comorbidities is felt to place them at high risk for further clinical deterioration. Furthermore, it is not anticipated that the patient will be medically stable for discharge from the hospital within 2 midnights of admission.   * I certify that at the point of admission it is my clinical judgment that the patient will require  inpatient hospital care spanning beyond 2 midnights from the point of admission due to high intensity of service, high risk for further deterioration and high frequency of surveillance required.*  Author: Karmen Bongo, MD 04/04/2022 9:56 AM  For on call review [email or web address].

## 2022-04-04 NOTE — ED Notes (Signed)
Pt returned from US

## 2022-04-04 NOTE — ED Notes (Signed)
RN gave pt pillows and readjusted pt in bed. RN updated pt and family at bedside.

## 2022-04-04 NOTE — Progress Notes (Signed)
Patient refused 5pm insulin. Patient states he is pre diabetic and does not want coverage. Yates,MD made aware.

## 2022-04-04 NOTE — Progress Notes (Signed)
Pharmacy Antibiotic Note  Arthur Shaw is a 76 y.o. male admitted on 04/04/2022 with  Intra-abdominal infection .  Pharmacy has been consulted for Zosyn (piperacillin-tazobactam) dosing.  WBC 8.6, Temp 98.2 SCr 1.12  Plan: Zosyn 3.375g IV x 1 (21mn infusion), followed by  Zosyn 3.375g IV q8h (4-hr infusion)  Monitor WBC, temp, Scr, and clinical s/sx of infection F/u cultures and de-escalate antibiotics as appropriate F/u general surgery plans    Temp (24hrs), Avg:98.2 F (36.8 C), Min:98.2 F (36.8 C), Max:98.2 F (36.8 C)  Recent Labs  Lab 04/04/22 0220  WBC 8.6  CREATININE 1.12    CrCl cannot be calculated (Unknown ideal weight.).    Allergies  Allergen Reactions   Statins     Other reaction(s): Other (See Comments) memory fatigue   Welchol [Colesevelam Hcl]     Muscle aches    Antimicrobials this admission: Ceftriaxone 7/3 x1 Zosyn 7/3 >>   Dose adjustments this admission: N/A  Microbiology results: pending  Thank you for allowing pharmacy to be a part of this patient's care.  CKaleen Mask7/12/2021 10:59 AM

## 2022-04-04 NOTE — Consult Note (Signed)
Consult Note  Arthur Shaw May 04, 1946  [id number].    Requesting MD: Dene Gentry, MD Chief Complaint/Reason for Consult: RUQ abdominal pain  HPI:  Patient reports 1 week hx of diarrhea and some upper abdominal pain in the last 2-3 days. Symptoms started more with diarrhea and patient took some imodium which initially helped but diarrhea returned. Developed upper abdominal pain that is burning and lasts a few minutes at a time. Episodes often resolved with TUMs. Denies fever, chills, SOB, nausea, vomiting, urinary symptoms. Of note patient had a routine colonoscopy in the last 1-2 months by Dr. Watt Climes that was unremarkable. Has also had trouble with swallowing pills recently and underwent EGD at the same time as colonoscopy and reports this was unremarkable as well. PMH otherwise significant for CAD s/p stent with last dose plavix yesterday, HTN, HLD, GERD, gout. Prior abdominal surgery includes gastric sleeve in 2014. Intolerant of statins. Wife at bedside and is a former Geophysicist/field seismologist.    ROS: Negative other than HPI  Family History  Problem Relation Age of Onset   Dementia Mother    Arrhythmia Father        afib   Heart failure Father    Fibromyalgia Sister    Hyperlipidemia Brother    CAD Brother    Drug abuse Son     Past Medical History:  Diagnosis Date   Adenomatous colon polyp    4/06 FS--[facility]; 9/07 colon: [facility] sm aden p   ASCVD (arteriosclerotic cardiovascular disease)    single vessel, s/p DE stent to mid LAD 4/06   Essential hypertension, benign    Fibromyalgia    questionable   Former smoker, stopped smoking in distant past    GERD (gastroesophageal reflux disease)    UGI 09/2012   Gout    Hyperlipidemia    Macular hole    at [facility]   Obesity    Prediabetes    Statin intolerance     Past Surgical History:  Procedure Laterality Date   CORONARY STENT PLACEMENT     taxus stent placed-Dr. Ilda Foil LAS 01/2005   STOMACH SURGERY     bariatric  surgery 05/19/13   TONSILLECTOMY      Social History:  reports that he has never smoked. He has never used smokeless tobacco. He reports current alcohol use. He reports that he does not use drugs.  Allergies:  Allergies  Allergen Reactions   Statins     Other reaction(s): Other (See Comments) memory fatigue   Welchol [Colesevelam Hcl]     Muscle aches    (Not in a hospital admission)   Blood pressure 138/63, pulse 76, temperature 98.2 F (36.8 C), resp. rate 10, SpO2 94 %. Physical Exam:  General: pleasant, WD, overweight male who is laying in bed in NAD HEENT: head is normocephalic, atraumatic.  Sclera are anicteric.  Ears and nose without any masses or lesions.  Mouth is pink and moist Heart: regular, rate, and rhythm.  Normal s1,s2. No obvious murmurs, gallops, or rubs noted.  Palpable radial and pedal pulses bilaterally Lungs: CTAB, no wheezes, rhonchi, or rales noted.  Respiratory effort nonlabored Abd: soft, mild to moderate RUQ and epigastric ttp without peritonitis, ND, +BS, no masses, hernias, or organomegaly MS: all 4 extremities are symmetrical with no cyanosis, clubbing, or edema. Skin: warm and dry with no masses, lesions, or rashes Neuro: Cranial nerves 2-12 grossly intact, sensation is normal throughout Psych: A&Ox3 with an appropriate affect.  Results for orders placed or performed during the hospital encounter of 04/04/22 (from the past 48 hour(s))  Basic metabolic panel     Status: Abnormal   Collection Time: 04/04/22  2:20 AM  Result Value Ref Range   Sodium 135 135 - 145 mmol/L   Potassium 3.0 (L) 3.5 - 5.1 mmol/L   Chloride 103 98 - 111 mmol/L   CO2 21 (L) 22 - 32 mmol/L   Glucose, Bld 176 (H) 70 - 99 mg/dL    Comment: Glucose reference range applies only to samples taken after fasting for at least 8 hours.   BUN 11 8 - 23 mg/dL   Creatinine, Ser 1.12 0.61 - 1.24 mg/dL   Calcium 8.6 (L) 8.9 - 10.3 mg/dL   GFR, Estimated >60 >60 mL/min    Comment:  (NOTE) Calculated using the CKD-EPI Creatinine Equation (2021)    Anion gap 11 5 - 15    Comment: Performed at [facility] [address], [place], [place] [address]  CBC     Status: None   Collection Time: 04/04/22  2:20 AM  Result Value Ref Range   WBC 8.6 4.0 - 10.5 K/uL   RBC 5.19 4.22 - 5.81 MIL/uL   Hemoglobin 15.4 13.0 - 17.0 g/dL   HCT 44.3 39.0 - 52.0 %   MCV 85.4 80.0 - 100.0 fL   MCH 29.7 26.0 - 34.0 pg   MCHC 34.8 30.0 - 36.0 g/dL   RDW 15.4 11.5 - 15.5 %   Platelets 274 150 - 400 K/uL   nRBC 0.0 0.0 - 0.2 %    Comment: Performed at [facility], [place] [address] [place], [place] [address]  Troponin I (High Sensitivity)     Status: None   Collection Time: 04/04/22  2:20 AM  Result Value Ref Range   Troponin I (High Sensitivity) 5 <18 ng/L    Comment: (NOTE) Elevated high sensitivity troponin I (hsTnI) values and significant  changes across serial measurements may suggest ACS but many other  chronic and acute conditions are known to elevate hsTnI results.  Refer to the "Links" section for chest pain algorithms and additional  guidance. Performed at [facility], [place] [address], [place], [place] [address]   Troponin I (High Sensitivity)     Status: None   Collection Time: 04/04/22  4:20 AM  Result Value Ref Range   Troponin I (High Sensitivity) 6 <18 ng/L    Comment: (NOTE) Elevated high sensitivity troponin I (hsTnI) values and significant  changes across serial measurements may suggest ACS but many other  chronic and acute conditions are known to elevate hsTnI results.  Refer to the "Links" section for chest pain algorithms and additional  guidance. Performed at [facility], [place] [address], [place], [place] [address]   Hepatic function panel     Status: Abnormal   Collection Time: 04/04/22  4:20 AM  Result Value Ref Range   Total Protein 7.2 6.5 - 8.1 g/dL   Albumin 3.6 3.5 - 5.0 g/dL   AST 20 15 - 41 U/L   ALT 13 0 - 44 U/L    Alkaline Phosphatase 72 38 - 126 U/L   Total Bilirubin 1.7 (H) 0.3 - 1.2 mg/dL   Bilirubin, Direct 0.3 (H) 0.0 - 0.2 mg/dL   Indirect Bilirubin 1.4 (H) 0.3 - 0.9 mg/dL    Comment: Performed at [facility] [address], [place], [place] [address]  Lipase, blood     Status: None   Collection Time: 04/04/22  4:20 AM  Result Value Ref Range   Lipase 23 11 - 51 U/L    Comment: Performed at [facility] [address], [place], [place] [address]   US Abdomen Limited RUQ (LIVER/GB)  Result Date: 04/04/2022 CLINICAL DATA:  76 year old male with epigastric abdominal pain. EXAM: ULTRASOUND ABDOMEN LIMITED RIGHT UPPER QUADRANT COMPARISON:  CT Abdomen and Pelvis 0459 hours today. FINDINGS: Gallbladder: Distended gallbladder. Cholelithiasis better demonstrated by CT. Gallbladder wall thickening of 5-6 mm on image 3. Positive sonographic Murphy sign. No pericholecystic fluid identified. Common bile duct: Diameter: Unable to visualize by ultrasound. CBD was up to 6-7 mm on the earlier CT, somewhat tapered distally on that exam. Liver: Suboptimally visualized. Background echogenicity within normal limits (image 36). No discrete liver lesion identified. Portal vein is patent on color Doppler imaging with normal direction of blood flow towards the liver. Other: No free fluid.  Negative visible right kidney. IMPRESSION: 1. Acute Cholecystitis: distended gallbladder with wall thickening and positive sonographic Murphy sign. Cholelithiasis better demonstrated by CT today. 2. CBD poorly visualized by ultrasound. Electronically Signed   By: Genevie Ann M.D.   On: 04/04/2022 07:13   CT ABDOMEN PELVIS W CONTRAST  Result Date: 04/04/2022 CLINICAL DATA:  Abdominal pain. EXAM: CT ABDOMEN AND PELVIS WITH CONTRAST TECHNIQUE: Multidetector CT imaging of the abdomen and pelvis was performed using the standard protocol following bolus administration of intravenous contrast. RADIATION DOSE REDUCTION: This exam was  performed according to the departmental dose-optimization program which includes automated exposure control, adjustment of the mA and/or kV according to patient size and/or use of iterative reconstruction technique. CONTRAST:  114m OMNIPAQUE IOHEXOL 300 MG/ML  SOLN COMPARISON:  None Available. FINDINGS: Lower chest: No acute abnormality. There is marked asymmetric elevation of the right hemidiaphragm Hepatobiliary: No suspicious liver lesion identified. Stones identified within the gallbladder which measure up to 1.6 cm. The gallbladder appears distended and there is mild pericholecystic edema. Cannot exclude cholecystitis. No bile duct dilatation. Pancreas: Unremarkable. No pancreatic ductal dilatation or surrounding inflammatory changes. Spleen: Normal in size without focal abnormality. Adrenals/Urinary Tract: Normal right adrenal gland. Benign left adrenal gland myelolipoma measures 3.1 x 2.8 cm, image 31/3. This is compatible with a benign abnormality. No follow-up imaging is recommended. Benign appearing Bosniak class 1 left kidney cyst arises off the posterior cortex of the upper pole measuring 3.7 cm, image 42/3. No follow-up imaging recommended. No nephrolithiasis or hydronephrosis identified bilaterally. Urinary bladder is within normal limits. Stomach/Bowel: Postoperative changes are identified compatible with previous sleeve gastrectomy. Small hiatal hernia is noted. No small bowel wall thickening, inflammation or distension. The appendix is visualized and appears normal, image 52/7. There is mild pericolonic haziness and equivocal wall thickening involving the ascending colon noted. Decompressed sigmoid colon and rectum identified with mild wall thickening, nonspecific. No signs pneumatosis. No evidence to suggest bowel obstruction. Vascular/Lymphatic: Aortic atherosclerosis. No aneurysm. No signs of abdominopelvic adenopathy. Reproductive: Mild prostate gland enlargement. Other: No free fluid or fluid  collections. No signs of pneumoperitoneum. Musculoskeletal: Thoracolumbar spondylosis. No acute or suspicious osseous findings. IMPRESSION: 1. Cholelithiasis with distended gallbladder and mild pericholecystic edema. Cannot exclude cholecystitis. Consider further evaluation with right upper quadrant ultrasound. 2. Mild pericolonic haziness and equivocal wall thickening involving the ascending colon and sigmoid colon and rectum. Recommend correlation for any clinical signs or symptoms suggestive of colitis. 3. Normal appendix. 4. Marked asymmetric elevation of the  right hemidiaphragm. 5. Aortic Atherosclerosis (ICD10-I70.0). Electronically Signed   By: Kerby Moors M.D.   On: 04/04/2022 05:49   DG Chest 2 View  Result Date: 04/04/2022 CLINICAL DATA:  Chest pain for 2 days EXAM: CHEST - 2 VIEW COMPARISON:  12/06/2011 FINDINGS: Cardiac shadow is within normal limits. The lungs are well aerated bilaterally. No focal infiltrate or effusion is seen. Mild scarring is noted in the bases bilaterally. Degenerative changes of the thoracic spine are noted. IMPRESSION: No active cardiopulmonary disease. Electronically Signed   By: Inez Catalina M.D.   On: 04/04/2022 02:36      Assessment/Plan RUQ abdominal pain Cholelithiasis  Possible Colitis  - CT today with cholelithiasis and mild pericholecystic edema, also shows mild pericolonic haziness and wall thickening involving ascending and sigmoid colon and rectum - no leukocytosis, Tbili mildly elevated at 1.7, LFTs otherwise normal - atypical presentation with diarrhea x1 week and now upper abdominal pain, mild-moderate RUQ pain on exam - recommend medical admission and HIDA this AM to better assess - if positive for acute cholecystitis would recommend laparoscopic cholecystectomy, if negative then would treat for colitis first - keep NPO, hold plavix, we will follow   FEN: NPO, IVF VTE: SCDs, ok to have LMWH or SQH, hold plavix  ID: [email or web address], consider adding  flagyl for possible colitis   - below per TRH -  CAD s/p stent on plavix - LD yesterday  HTN HLD GERD Gout Hx of gastric sleeve in 2014 in [place]   I reviewed ED provider notes, last 24 h vitals and pain scores, last 48 h intake and output, last 24 h labs and trends, and last 24 h imaging results.   Norm Parcel, [facility] Surgery 04/04/2022, 9:17 AM Please see Amion for pager number during day hours 7:00am-4:30pm

## 2022-04-04 NOTE — ED Notes (Signed)
Ambulated from bathroom and back

## 2022-04-04 NOTE — Progress Notes (Signed)
Patient arrived to [place] room 4 alert and oriented x4. Patient ambulated from stretcher to bed with just stand by assist with no issues. Pain level 0/10. Wife at bedside. Bed in lowest position, call light in reach. Will continue to monitor patient.

## 2022-04-04 NOTE — ED Notes (Signed)
Per pt's wife, medications need to be crushed before he can have anything due to choking.

## 2022-04-04 NOTE — ED Triage Notes (Signed)
Pt states that he has been having CP all day, with some nausea and diarrhea. Denies SOB

## 2022-04-05 ENCOUNTER — Encounter (HOSPITAL_COMMUNITY)
Admission: EM | Disposition: A | Discharge status: Home / Self Care | Payer: Self-pay | Source: Home / Self Care | Attending: Internal Medicine

## 2022-04-05 ENCOUNTER — Inpatient Hospital Stay (HOSPITAL_COMMUNITY): Payer: PPO | Admitting: Certified Registered Nurse Anesthetist

## 2022-04-05 ENCOUNTER — Other Ambulatory Visit: Payer: Self-pay

## 2022-04-05 ENCOUNTER — Encounter (HOSPITAL_COMMUNITY): Payer: Self-pay | Admitting: Internal Medicine

## 2022-04-05 DIAGNOSIS — K810 Acute cholecystitis: Secondary | ICD-10-CM

## 2022-04-05 DIAGNOSIS — K8000 Calculus of gallbladder with acute cholecystitis without obstruction: Secondary | ICD-10-CM | POA: Diagnosis not present

## 2022-04-05 HISTORY — PX: CHOLECYSTECTOMY: SHX55

## 2022-04-05 LAB — COMPREHENSIVE METABOLIC PANEL
ALT: 13 U/L (ref 0–44)
AST: 18 U/L (ref 15–41)
Albumin: 2.7 g/dL — ABNORMAL LOW (ref 3.5–5.0)
Alkaline Phosphatase: 57 U/L (ref 38–126)
Anion gap: 11 (ref 5–15)
BUN: 11 mg/dL (ref 8–23)
CO2: 22 mmol/L (ref 22–32)
Calcium: 8.1 mg/dL — ABNORMAL LOW (ref 8.9–10.3)
Chloride: 104 mmol/L (ref 98–111)
Creatinine, Ser: 0.96 mg/dL (ref 0.61–1.24)
GFR, Estimated: >60 mL/min (ref >60)
Glucose, Bld: 123 mg/dL — ABNORMAL HIGH (ref 70–99)
Potassium: 2.6 mmol/L — CL (ref 3.5–5.1)
Sodium: 137 mmol/L (ref 135–145)
Total Bilirubin: 1.5 mg/dL — ABNORMAL HIGH (ref 0.3–1.2)
Total Protein: 5.6 g/dL — ABNORMAL LOW (ref 6.5–8.1)

## 2022-04-05 LAB — BASIC METABOLIC PANEL
Anion gap: 12 (ref 5–15)
BUN: 17 mg/dL (ref 8–23)
CO2: 20 mmol/L — ABNORMAL LOW (ref 22–32)
Calcium: 8.1 mg/dL — ABNORMAL LOW (ref 8.9–10.3)
Chloride: 105 mmol/L (ref 98–111)
Creatinine, Ser: 0.99 mg/dL (ref 0.61–1.24)
GFR, Estimated: >60 mL/min (ref >60)
Glucose, Bld: 176 mg/dL — ABNORMAL HIGH (ref 70–99)
Potassium: 3.7 mmol/L (ref 3.5–5.1)
Sodium: 137 mmol/L (ref 135–145)

## 2022-04-05 LAB — CBC
HCT: 39.7 % (ref 39.0–52.0)
Hemoglobin: 13.6 g/dL (ref 13.0–17.0)
MCH: 29.3 pg (ref 26.0–34.0)
MCHC: 34.3 g/dL (ref 30.0–36.0)
MCV: 85.6 fL (ref 80.0–100.0)
Platelets: 216 10*3/uL (ref 150–400)
RBC: 4.64 MIL/uL (ref 4.22–5.81)
RDW: 15.8 % — ABNORMAL HIGH (ref 11.5–15.5)
WBC: 17.5 10*3/uL — ABNORMAL HIGH (ref 4.0–10.5)
nRBC: 0 % (ref 0.0–0.2)

## 2022-04-05 LAB — MAGNESIUM
Magnesium: 1.7 mg/dL (ref 1.7–2.4)
Magnesium: 1.7 mg/dL (ref 1.7–2.4)

## 2022-04-05 LAB — GLUCOSE, CAPILLARY
Glucose-Capillary: 118 mg/dL — ABNORMAL HIGH (ref 70–99)
Glucose-Capillary: 200 mg/dL — ABNORMAL HIGH (ref 70–99)

## 2022-04-05 SURGERY — LAPAROSCOPIC CHOLECYSTECTOMY
Anesthesia: General | Site: Abdomen

## 2022-04-05 MED ORDER — BUPIVACAINE-EPINEPHRINE 0.25% -1:200000 IJ SOLN
INTRAMUSCULAR | Status: DC | PRN
  Administered 2022-04-05: 22 mL

## 2022-04-05 MED ORDER — PHENYLEPHRINE 80 MCG/ML (10ML) SYRINGE FOR IV PUSH (FOR BLOOD PRESSURE SUPPORT)
PREFILLED_SYRINGE | INTRAVENOUS | Status: AC
  Filled 2022-04-05: qty 30

## 2022-04-05 MED ORDER — FENTANYL CITRATE (PF) 250 MCG/5ML IJ SOLN
INTRAMUSCULAR | Status: AC
  Filled 2022-04-05: qty 5

## 2022-04-05 MED ORDER — DEXAMETHASONE SODIUM PHOSPHATE 10 MG/ML IJ SOLN
INTRAMUSCULAR | Status: DC | PRN
  Administered 2022-04-05: 10 mg via INTRAVENOUS

## 2022-04-05 MED ORDER — METHOCARBAMOL 1000 MG/10ML IJ SOLN
500.0000 mg | Freq: Four times a day (QID) | INTRAMUSCULAR | Status: DC | PRN
  Filled 2022-04-05: qty 5

## 2022-04-05 MED ORDER — 0.9 % SODIUM CHLORIDE (POUR BTL) OPTIME
TOPICAL | Status: DC | PRN
  Administered 2022-04-05: 1000 mL

## 2022-04-05 MED ORDER — ACETAMINOPHEN 500 MG PO TABS: 1000.0000 mg | ORAL_TABLET | Freq: Once | ORAL | Status: DC | PRN

## 2022-04-05 MED ORDER — ONDANSETRON HCL 4 MG/2ML IJ SOLN
INTRAMUSCULAR | Status: DC | PRN
  Administered 2022-04-05: 4 mg via INTRAVENOUS

## 2022-04-05 MED ORDER — AMLODIPINE BESYLATE 2.5 MG PO TABS
2.5000 mg | ORAL_TABLET | Freq: Every day | ORAL | Status: DC
  Administered 2022-04-06 – 2022-04-07 (×2): 2.5 mg via ORAL
  Filled 2022-04-05 (×2): qty 1

## 2022-04-05 MED ORDER — ROCURONIUM BROMIDE 10 MG/ML (PF) SYRINGE
PREFILLED_SYRINGE | INTRAVENOUS | Status: AC
  Filled 2022-04-05: qty 20

## 2022-04-05 MED ORDER — PROPOFOL 10 MG/ML IV BOLUS
INTRAVENOUS | Status: DC | PRN
  Administered 2022-04-05: 30 mg via INTRAVENOUS
  Administered 2022-04-05: 100 mg via INTRAVENOUS
  Administered 2022-04-05: 30 mg via INTRAVENOUS
  Administered 2022-04-05: 20 mg via INTRAVENOUS

## 2022-04-05 MED ORDER — FENTANYL CITRATE (PF) 100 MCG/2ML IJ SOLN
25.0000 ug | INTRAMUSCULAR | Status: DC | PRN
  Administered 2022-04-05 (×2): 25 ug via INTRAVENOUS

## 2022-04-05 MED ORDER — ACETAMINOPHEN 160 MG/5ML PO SOLN: 1000.0000 mg | Freq: Once | ORAL | Status: DC | PRN

## 2022-04-05 MED ORDER — CHLORHEXIDINE GLUCONATE CLOTH 2 % EX PADS: 6.0000 | MEDICATED_PAD | Freq: Once | CUTANEOUS | Status: DC

## 2022-04-05 MED ORDER — FENTANYL CITRATE (PF) 100 MCG/2ML IJ SOLN
INTRAMUSCULAR | Status: AC
  Filled 2022-04-05: qty 2

## 2022-04-05 MED ORDER — ACETAMINOPHEN 10 MG/ML IV SOLN
INTRAVENOUS | Status: AC
  Filled 2022-04-05: qty 100

## 2022-04-05 MED ORDER — ACETAMINOPHEN-CODEINE 120-12 MG/5ML PO SOLN
10.0000 mL | Freq: Four times a day (QID) | ORAL | Status: DC | PRN
Start: 2022-04-05 — End: 2022-04-05

## 2022-04-05 MED ORDER — PANTOPRAZOLE SODIUM 40 MG IV SOLR
40.0000 mg | INTRAVENOUS | Status: DC
  Administered 2022-04-05: 40 mg via INTRAVENOUS
  Filled 2022-04-05: qty 10

## 2022-04-05 MED ORDER — ROCURONIUM BROMIDE 10 MG/ML (PF) SYRINGE
PREFILLED_SYRINGE | INTRAVENOUS | Status: DC | PRN
  Administered 2022-04-05: 70 mg via INTRAVENOUS

## 2022-04-05 MED ORDER — POTASSIUM CHLORIDE 10 MEQ/100ML IV SOLN
10.0000 meq | INTRAVENOUS | Status: AC
  Administered 2022-04-05 (×4): 10 meq via INTRAVENOUS
  Filled 2022-04-05 (×4): qty 100

## 2022-04-05 MED ORDER — SODIUM CHLORIDE 0.9 % IR SOLN
Status: DC | PRN
  Administered 2022-04-05: 2000 mL

## 2022-04-05 MED ORDER — LIDOCAINE 2% (20 MG/ML) 5 ML SYRINGE
INTRAMUSCULAR | Status: AC
Start: 2022-04-05 — End: ?
  Filled 2022-04-05: qty 10

## 2022-04-05 MED ORDER — EPHEDRINE 5 MG/ML INJ
INTRAVENOUS | Status: AC
  Filled 2022-04-05: qty 5

## 2022-04-05 MED ORDER — SUGAMMADEX SODIUM 200 MG/2ML IV SOLN
INTRAVENOUS | Status: DC | PRN
  Administered 2022-04-05: 100 mg via INTRAVENOUS
  Administered 2022-04-05: 200 mg via INTRAVENOUS

## 2022-04-05 MED ORDER — FENTANYL CITRATE (PF) 250 MCG/5ML IJ SOLN
INTRAMUSCULAR | Status: DC | PRN
  Administered 2022-04-05 (×3): 50 ug via INTRAVENOUS
  Administered 2022-04-05 (×2): 100 ug via INTRAVENOUS

## 2022-04-05 MED ORDER — CHLORHEXIDINE GLUCONATE CLOTH 2 % EX PADS
6.0000 | MEDICATED_PAD | Freq: Once | CUTANEOUS | Status: AC
  Administered 2022-04-05: 6 via TOPICAL

## 2022-04-05 MED ORDER — BUPIVACAINE-EPINEPHRINE (PF) 0.25% -1:200000 IJ SOLN
INTRAMUSCULAR | Status: AC
  Filled 2022-04-05: qty 30

## 2022-04-05 MED ORDER — SUCCINYLCHOLINE CHLORIDE 200 MG/10ML IV SOSY
PREFILLED_SYRINGE | INTRAVENOUS | Status: AC
  Filled 2022-04-05: qty 20

## 2022-04-05 MED ORDER — ACETAMINOPHEN-CODEINE 300-30 MG PO TABS
1.0000 | ORAL_TABLET | Freq: Four times a day (QID) | ORAL | Status: DC | PRN
  Administered 2022-04-06 – 2022-04-07 (×2): 1 via ORAL
  Filled 2022-04-05 (×2): qty 1

## 2022-04-05 MED ORDER — LIDOCAINE 2% (20 MG/ML) 5 ML SYRINGE
INTRAMUSCULAR | Status: DC | PRN
  Administered 2022-04-05: 60 mg via INTRAVENOUS

## 2022-04-05 MED ORDER — POTASSIUM CHLORIDE 10 MEQ/100ML IV SOLN
10.0000 meq | INTRAVENOUS | Status: AC
  Administered 2022-04-05 (×2): 10 meq via INTRAVENOUS
  Filled 2022-04-05 (×2): qty 100

## 2022-04-05 MED ORDER — ONDANSETRON HCL 4 MG/2ML IJ SOLN
INTRAMUSCULAR | Status: AC
  Filled 2022-04-05: qty 2

## 2022-04-05 MED ORDER — OXYCODONE HCL 5 MG PO TABS: 5.0000 mg | ORAL_TABLET | Freq: Once | ORAL | Status: DC | PRN

## 2022-04-05 MED ORDER — ACETAMINOPHEN 10 MG/ML IV SOLN
1000.0000 mg | Freq: Once | INTRAVENOUS | Status: DC | PRN
  Administered 2022-04-05: 1000 mg via INTRAVENOUS

## 2022-04-05 MED ORDER — OXYCODONE HCL 5 MG/5ML PO SOLN: 5.0000 mg | Freq: Once | ORAL | Status: DC | PRN

## 2022-04-05 MED ORDER — DEXAMETHASONE SODIUM PHOSPHATE 10 MG/ML IJ SOLN
INTRAMUSCULAR | Status: AC
Start: 2022-04-05 — End: ?
  Filled 2022-04-05: qty 1

## 2022-04-05 SURGICAL SUPPLY — 61 items
ADH SKN CLS APL DERMABOND .7 (GAUZE/BANDAGES/DRESSINGS) ×2
APL PRP STRL LF DISP 70% ISPRP (MISCELLANEOUS) ×2
APL SRG 38 LTWT LNG FL B (MISCELLANEOUS) ×2
APPLICATOR ARISTA FLEXITIP XL (MISCELLANEOUS) ×2 IMPLANT
APPLIER CLIP 5 13 M/L LIGAMAX5 (MISCELLANEOUS) ×3
APR CLP MED LRG 5 ANG JAW (MISCELLANEOUS) ×2
BAG COUNTER SPONGE SURGICOUNT (BAG) ×3 IMPLANT
BAG SPEC RTRVL LRG 6X4 10 (ENDOMECHANICALS) ×2
BAG SPNG CNTER NS LX DISP (BAG) ×2
BIOPATCH RED 1 DISK 7.0 (GAUZE/BANDAGES/DRESSINGS) ×2 IMPLANT
BLADE CLIPPER SURG (BLADE) ×2 IMPLANT
CANISTER SUCT 3000ML PPV (MISCELLANEOUS) ×3 IMPLANT
CHLORAPREP W/TINT 26 (MISCELLANEOUS) ×3 IMPLANT
CLIP APPLIE 5 13 M/L LIGAMAX5 (MISCELLANEOUS) ×2 IMPLANT
COVER MAYO STAND STRL (DRAPES) ×3 IMPLANT
COVER SURGICAL LIGHT HANDLE (MISCELLANEOUS) ×3 IMPLANT
DERMABOND ADVANCED (GAUZE/BANDAGES/DRESSINGS) ×1
DERMABOND ADVANCED .7 DNX12 (GAUZE/BANDAGES/DRESSINGS) ×2 IMPLANT
DRAIN CHANNEL 19F RND (DRAIN) ×2 IMPLANT
DRAPE C-ARM 42X120 X-RAY (DRAPES) ×3 IMPLANT
DRSG TEGADERM 2-3/8X2-3/4 SM (GAUZE/BANDAGES/DRESSINGS) ×2 IMPLANT
ELECT REM PT RETURN 9FT ADLT (ELECTROSURGICAL) ×3
ELECTRODE REM PT RTRN 9FT ADLT (ELECTROSURGICAL) ×2 IMPLANT
EVACUATOR SILICONE 100CC (DRAIN) ×2 IMPLANT
GLOVE BIOGEL PI IND STRL 6 (GLOVE) ×2 IMPLANT
GLOVE BIOGEL PI INDICATOR 6 (GLOVE) ×1
GLOVE BIOGEL PI MICRO 5.5 (GLOVE) ×1
GLOVE BIOGEL PI MICRO STRL 5.5 (GLOVE) ×2 IMPLANT
GOWN STRL REUS W/ TWL LRG LVL3 (GOWN DISPOSABLE) ×6 IMPLANT
GOWN STRL REUS W/TWL LRG LVL3 (GOWN DISPOSABLE) ×9
HEMOSTAT ARISTA ABSORB 3G PWDR (HEMOSTASIS) ×2 IMPLANT
KIT BASIN OR (CUSTOM PROCEDURE TRAY) ×3 IMPLANT
KIT TURNOVER KIT B (KITS) ×3 IMPLANT
L-HOOK LAP DISP 36CM (ELECTROSURGICAL) ×3
LHOOK LAP DISP 36CM (ELECTROSURGICAL) ×2 IMPLANT
NDL INSUFFLATION 14GA 120MM (NEEDLE) IMPLANT
NEEDLE INSUFFLATION 14GA 120MM (NEEDLE) IMPLANT
NS IRRIG 1000ML POUR BTL (IV SOLUTION) ×3 IMPLANT
PAD ARMBOARD 7.5X6 YLW CONV (MISCELLANEOUS) ×3 IMPLANT
PENCIL BUTTON HOLSTER BLD 10FT (ELECTRODE) ×3 IMPLANT
POUCH SPECIMEN RETRIEVAL 10MM (ENDOMECHANICALS) ×3 IMPLANT
SCISSORS LAP 5X35 DISP (ENDOMECHANICALS) ×3 IMPLANT
SET CHOLANGIOGRAPH 5 50 .035 (SET/KITS/TRAYS/PACK) ×3 IMPLANT
SET IRRIG TUBING LAPAROSCOPIC (IRRIGATION / IRRIGATOR) ×3 IMPLANT
SET TUBE SMOKE EVAC HIGH FLOW (TUBING) ×3 IMPLANT
SLEEVE ENDOPATH XCEL 5M (ENDOMECHANICALS) ×6 IMPLANT
SPECIMEN JAR SMALL (MISCELLANEOUS) ×3 IMPLANT
SUT ETHILON 2 0 FS 18 (SUTURE) ×2 IMPLANT
SUT MNCRL AB 4-0 PS2 18 (SUTURE) ×3 IMPLANT
SUT VIC AB 3-0 SH 27 (SUTURE)
SUT VIC AB 3-0 SH 27XBRD (SUTURE) IMPLANT
SUT VICRYL 0 UR6 27IN ABS (SUTURE) ×3 IMPLANT
SUT VLOC 180 0 6IN GS21 (SUTURE) ×2 IMPLANT
TOWEL GREEN STERILE (TOWEL DISPOSABLE) ×3 IMPLANT
TOWEL GREEN STERILE FF (TOWEL DISPOSABLE) ×3 IMPLANT
TRAY LAPAROSCOPIC MC (CUSTOM PROCEDURE TRAY) ×3 IMPLANT
TROCAR XCEL 12X100 BLDLESS (ENDOMECHANICALS) IMPLANT
TROCAR XCEL BLUNT TIP 100MML (ENDOMECHANICALS) ×3 IMPLANT
TROCAR XCEL NON-BLD 5MMX100MML (ENDOMECHANICALS) ×3 IMPLANT
TROCAR Z-THREAD BLADED 11X100M (TROCAR) ×2 IMPLANT
WATER STERILE IRR 1000ML POUR (IV SOLUTION) ×3 IMPLANT

## 2022-04-05 NOTE — Progress Notes (Signed)
I examined the patient and reviewed the details of lap chole with him and his wife. He understands increased risk of bleeding given plavix dosing 2 days ago. However given worsening leukocytosis I recommend proceeding with cholecystectomy. Proceed to OR this afternoon.  Michaelle Birks, [facility], Hepatobiliary and Pancreatic Surgery 04/05/22 2:12 PM

## 2022-04-05 NOTE — Progress Notes (Signed)
Patient transported to OR via bed.

## 2022-04-05 NOTE — Progress Notes (Signed)
Report called clair,RN in OR. Nurse verbalized understanding or report. Consent signed and placed in chart. Undergarments and dentures removed and left with patient at bedside.

## 2022-04-05 NOTE — Progress Notes (Signed)
Patient arrived back to [place] room 4 alert and oriented x4, pain level 5/10. Patient has 3 lap sites with skin glue clean dry and intact. Also has new JP drain that is to charged posiition. Will continue to monitor patient. Wife at bedside. Posey Pronto, MD just arrived to room to see patient.

## 2022-04-05 NOTE — Hospital Course (Signed)
Arthur Shaw is a 76 year old male with PMH CAD s/p PCI, HTN, HLD, obesity s/p gastric bypass surgery who presented to the hospital with complaints of chest pain.  Found to have acute cholecystitis on workup.  He underwent lap chole on 04/05/22.  He remains high risk for bile leak and therefore biliary drain remained in place.

## 2022-04-05 NOTE — Progress Notes (Signed)
Pt situation reviewed with on call MD. Both feel the benefit outweigh the risk of cholecystectomy due to increasing WBC and pain  Recommend proceeding with surgery and discussed potential increased risk of bleeding but rising WBC is concerning. Discussed postponing surgery and possible perc drain which does not necessarily improve his situation long term  He agrees to proceed  The procedure has been discussed with the patient. Operative and non operative treatments have been discussed. Risks of surgery include bleeding, infection,  Common bile duct injury,  Injury to the stomach,liver, colon,small intestine, abdominal wall,  Diaphragm,  Major blood vessels,  And the need for an open procedure.  Other risks include worsening of medical problems, death,  DVT and pulmonary embolism, and cardiovascular events.   Medical options have also been discussed. The patient has been informed of long term expectations of surgery and non surgical options,  The patient agrees to proceed.

## 2022-04-05 NOTE — Progress Notes (Signed)
Subjective/Chief Complaint: Patient's HIDA showed acute cholecystitis.  Complains of right shoulder pain and right upper quadrant pain.  He still having diarrhea.  He had only 1 bowel movement last night that was recorded.  Denies blood in the stool.   Objective: Vital signs in last 24 hours: Temp:  [98.1 F (36.7 C)-99.6 F (37.6 C)] 99.6 F (37.6 C) (07/04 0759) Pulse Rate:  [69-95] 71 (07/04 0759) Resp:  [14-21] 21 (07/04 0759) BP: (103-141)/(62-79) 119/63 (07/04 0759) SpO2:  [94 %-95 %] 95 % (07/04 0759) Last BM Date : 04/05/22  Intake/Output from previous day: 07/03 0701 - 07/04 0700 In: 501.6 [I.V.:291.4; IV Piggyback:210.1] Out: -  Intake/Output this shift: No intake/output data recorded.   General: pleasant, WD, overweight male who is laying in bed in NAD HEENT: head is normocephalic, atraumatic.  Sclera are anicteric.  Ears and nose without any masses or lesions.  Mouth is pink and moist Heart: regular, rate, and rhythm.  Normal s1,s2. No obvious murmurs, gallops, or rubs noted.  Palpable radial and pedal pulses bilaterally Lungs: CTAB, no wheezes, rhonchi, or rales noted.  Respiratory effort nonlabored Abd: soft, mild to moderate RUQ and epigastric ttp without peritonitis, ND, no masses, hernias, or organomegaly Lab Results:  Recent Labs    04/04/22 0220 04/05/22 0036  WBC 8.6 17.5*  HGB 15.4 13.6  HCT 44.3 39.7  PLT 274 216   BMET Recent Labs    04/04/22 0220 04/05/22 0036  NA 135 137  K 3.0* 2.6*  CL 103 104  CO2 21* 22  GLUCOSE 176* 123*  BUN 11 11  CREATININE 1.12 0.96  CALCIUM 8.6* 8.1*   PT/INR No results for input(s): "LABPROT", "INR" in the last 72 hours. ABG No results for input(s): "PHART", "HCO3" in the last 72 hours.  Invalid input(s): "PCO2", "PO2"  Studies/Results: NM Hepatobiliary Liver Func  Result Date: 04/04/2022 CLINICAL DATA:  Concern for acute cholecystitis. Abnormal ultrasound and CT same day. EXAM: NUCLEAR MEDICINE  HEPATOBILIARY IMAGING TECHNIQUE: Sequential images of the abdomen were obtained out to 60 minutes following intravenous administration of radiopharmaceutical. RADIOPHARMACEUTICALS:  5.0 mCi Tc-40m Choletec IV COMPARISON:  Ultrasound and CT 04/04/2022 FINDINGS: Prompt clearance radiotracer from blood pool and homogeneous uptake in liver. Counts are evident the common bile duct by 15 minutes. Counts enter the small bowel by 25 minutes. Gallbladder not identified at 1 hour. 3 mg IV morphine were administered to augment filling of the gallbladder. No filling of the gallbladder after morphine augmentation. IMPRESSION: 1. Non filling of the gallbladder is consistent with ACUTE CHOLECYSTITIS. 2. Patent cystic duct. These results will be called to the ordering clinician or representative by the Radiologist Assistant, and communication documented in the PACS or [employer] Electronically Signed   By: SSuzy BouchardM.D.   On: 04/04/2022 16:57   [place]Abdomen Limited RUQ (LIVER/GB)  Result Date: 04/04/2022 CLINICAL DATA:  76year old male with epigastric abdominal pain. EXAM: ULTRASOUND ABDOMEN LIMITED RIGHT UPPER QUADRANT COMPARISON:  CT Abdomen and Pelvis 0459 hours today. FINDINGS: Gallbladder: Distended gallbladder. Cholelithiasis better demonstrated by CT. Gallbladder wall thickening of 5-6 mm on image 3. Positive sonographic Murphy sign. No pericholecystic fluid identified. Common bile duct: Diameter: Unable to visualize by ultrasound. CBD was up to 6-7 mm on the earlier CT, somewhat tapered distally on that exam. Liver: Suboptimally visualized. Background echogenicity within normal limits (image 36). No discrete liver lesion identified. Portal vein is patent on color Doppler imaging with normal direction of blood  flow towards the liver. Other: No free fluid.  Negative visible right kidney. IMPRESSION: 1. Acute Cholecystitis: distended gallbladder with wall thickening and positive sonographic Murphy sign.  Cholelithiasis better demonstrated by CT today. 2. CBD poorly visualized by ultrasound. Electronically Signed   By: Genevie Ann M.D.   On: 04/04/2022 07:13   CT ABDOMEN PELVIS W CONTRAST  Result Date: 04/04/2022 CLINICAL DATA:  Abdominal pain. EXAM: CT ABDOMEN AND PELVIS WITH CONTRAST TECHNIQUE: Multidetector CT imaging of the abdomen and pelvis was performed using the standard protocol following bolus administration of intravenous contrast. RADIATION DOSE REDUCTION: This exam was performed according to the departmental dose-optimization program which includes automated exposure control, adjustment of the mA and/or kV according to patient size and/or use of iterative reconstruction technique. CONTRAST:  162m OMNIPAQUE IOHEXOL 300 MG/ML  SOLN COMPARISON:  None Available. FINDINGS: Lower chest: No acute abnormality. There is marked asymmetric elevation of the right hemidiaphragm Hepatobiliary: No suspicious liver lesion identified. Stones identified within the gallbladder which measure up to 1.6 cm. The gallbladder appears distended and there is mild pericholecystic edema. Cannot exclude cholecystitis. No bile duct dilatation. Pancreas: Unremarkable. No pancreatic ductal dilatation or surrounding inflammatory changes. Spleen: Normal in size without focal abnormality. Adrenals/Urinary Tract: Normal right adrenal gland. Benign left adrenal gland myelolipoma measures 3.1 x 2.8 cm, image 31/3. This is compatible with a benign abnormality. No follow-up imaging is recommended. Benign appearing Bosniak class 1 left kidney cyst arises off the posterior cortex of the upper pole measuring 3.7 cm, image 42/3. No follow-up imaging recommended. No nephrolithiasis or hydronephrosis identified bilaterally. Urinary bladder is within normal limits. Stomach/Bowel: Postoperative changes are identified compatible with previous sleeve gastrectomy. Small hiatal hernia is noted. No small bowel wall thickening, inflammation or distension.  The appendix is visualized and appears normal, image 52/7. There is mild pericolonic haziness and equivocal wall thickening involving the ascending colon noted. Decompressed sigmoid colon and rectum identified with mild wall thickening, nonspecific. No signs pneumatosis. No evidence to suggest bowel obstruction. Vascular/Lymphatic: Aortic atherosclerosis. No aneurysm. No signs of abdominopelvic adenopathy. Reproductive: Mild prostate gland enlargement. Other: No free fluid or fluid collections. No signs of pneumoperitoneum. Musculoskeletal: Thoracolumbar spondylosis. No acute or suspicious osseous findings. IMPRESSION: 1. Cholelithiasis with distended gallbladder and mild pericholecystic edema. Cannot exclude cholecystitis. Consider further evaluation with right upper quadrant ultrasound. 2. Mild pericolonic haziness and equivocal wall thickening involving the ascending colon and sigmoid colon and rectum. Recommend correlation for any clinical signs or symptoms suggestive of colitis. 3. Normal appendix. 4. Marked asymmetric elevation of the right hemidiaphragm. 5. Aortic Atherosclerosis (ICD10-I70.0). Electronically Signed   By: TKerby MoorsM.D.   On: 04/04/2022 05:49   DG Chest 2 View  Result Date: 04/04/2022 CLINICAL DATA:  Chest pain for 2 days EXAM: CHEST - 2 VIEW COMPARISON:  12/06/2011 FINDINGS: Cardiac shadow is within normal limits. The lungs are well aerated bilaterally. No focal infiltrate or effusion is seen. Mild scarring is noted in the bases bilaterally. Degenerative changes of the thoracic spine are noted. IMPRESSION: No active cardiopulmonary disease. Electronically Signed   By: MInez CatalinaM.D.   On: 04/04/2022 02:36    Anti-infectives: Anti-infectives (From admission, onward)    Start     Dose/Rate Route Frequency Ordered Stop   04/04/22 1700  piperacillin-tazobactam (ZOSYN) IVPB 3.375 g       See Hyperspace for full Linked Orders Report.   3.375 g 12.5 mL/hr over 240 Minutes  Intravenous Every 8 hours 04/04/22 1059  04/04/22 1100  piperacillin-tazobactam (ZOSYN) IVPB 3.375 g       See Hyperspace for full Linked Orders Report.   3.375 g 100 mL/hr over 30 Minutes Intravenous  Once 04/04/22 1059 04/04/22 1201   04/04/22 0730  cefTRIAXone (ROCEPHIN) 2 g in sodium chloride 0.9 % 100 mL IVPB        2 g 200 mL/hr over 30 Minutes Intravenous  Once 04/04/22 0722 04/04/22 0810       Assessment/Plan: Acute cholecystitis-HIDA positive and patient is stones and inflammation in the gallbladder by CT and ultrasound criteria as well.  His white count is jumped up to 17,000.  Patient will require laparoscopic cholecystectomy.  Other option is percutaneous cholecystostomy tube.  He is on Plavix his wife tells me he took his last dose 2 days ago.  I will discuss with Dr. Zenia Resides to see if she wished to proceed today or should we wait till the Plavix to wear off somewhat.  Discussed with his wife and patient at bedside.  She has worked at the hospital before and is familiar with hospital logistics and surgical logistics.  If surgery is postponed, he may have a clear liquid diet.  We will add Robaxin for shoulder pain but more than likely this represents acute cholecystitis referral pain.  Colitis: Unclear if he has colitis or if this is a secondary to acute cholecystitis.  His white count has jumped up to 17,000.   LOS: 1 day    Turner Daniels MD  04/05/2022

## 2022-04-05 NOTE — Anesthesia Preprocedure Evaluation (Signed)
Anesthesia Evaluation  Patient identified by MRN, date of birth, ID band Patient awake    Reviewed: Allergy & Precautions, NPO status , Patient's Chart, lab work & pertinent test results  History of Anesthesia Complications Negative for: history of anesthetic complications  Airway Mallampati: III  TM Distance: >3 FB Neck ROM: Full    Dental  (+) Dental Advisory Given   Pulmonary    breath sounds clear to auscultation       Cardiovascular hypertension, Pt. on medications and Pt. on home beta blockers + CAD   Rhythm:Regular     Neuro/Psych PSYCHIATRIC DISORDERS Anxiety  Neuromuscular disease    GI/Hepatic GERD  ,Lab Results      Component                Value               Date                      ALT                      13                  04/05/2022                AST                      18                  04/05/2022                ALKPHOS                  57                  04/05/2022                BILITOT                  1.5 (H)             04/05/2022            Acute cholecystitis   Endo/Other  negative endocrine ROS  Renal/GU Lab Results      Component                Value               Date                      CREATININE               0.96                04/05/2022                Musculoskeletal  (+) Fibromyalgia -  Abdominal   Peds  Hematology Lab Results      Component                Value               Date                      WBC                      17.5 (H)  04/05/2022                HGB                      13.6                04/05/2022                HCT                      39.7                04/05/2022                MCV                      85.6                04/05/2022                PLT                      216                 04/05/2022              Anesthesia Other Findings   Reproductive/Obstetrics                            Anesthesia  Physical Anesthesia Plan  ASA: 2  Anesthesia Plan: General   Post-op Pain Management: Toradol IV (intra-op)* and Ofirmev IV (intra-op)*   Induction: Intravenous  PONV Risk Score and Plan: 2 and Ondansetron and Dexamethasone  Airway Management Planned: Oral ETT  Additional Equipment: None  Intra-op Plan:   Post-operative Plan: Extubation in OR  Informed Consent: I have reviewed the patients History and Physical, chart, labs and discussed the procedure including the risks, benefits and alternatives for the proposed anesthesia with the patient or authorized representative who has indicated his/her understanding and acceptance.     Dental advisory given  Plan Discussed with: CRNA  Anesthesia Plan Comments:         Anesthesia Quick Evaluation

## 2022-04-05 NOTE — Transfer of Care (Signed)
Immediate Anesthesia Transfer of Care Note  Patient: Arthur Shaw  Procedure(s) Performed: LAPAROSCOPIC CHOLECYSTECTOMY (Abdomen)  Patient Location: PACU  Anesthesia Type:General  Level of Consciousness: patient cooperative and responds to stimulation  Airway & Oxygen Therapy: Patient Spontanous Breathing and Patient connected to face mask oxygen  Post-op Assessment: Report given to RN and Post -op Vital signs reviewed and stable  Post vital signs: Reviewed and stable  Last Vitals:  Vitals Value Taken Time  BP 140/67 04/05/22 1648  Temp    Pulse 99 04/05/22 1652  Resp 17 04/05/22 1652  SpO2 95 % 04/05/22 1652  Vitals shown include unvalidated device data.  Last Pain:  Vitals:   04/05/22 0759  TempSrc: Oral  PainSc:          Complications: No notable events documented.

## 2022-04-05 NOTE — Op Note (Addendum)
Date: 04/05/22  Patient: TEKOA AMON MRN: [id number]  Preoperative Diagnosis: Acute cholecystitis Postoperative Diagnosis: Acute gangrenous cholecystitis  Procedure: Laparoscopic subtotal fenestrating cholecystectomy with drain placement  Surgeon: Michaelle Birks, MD  EBL: 50 mL  Anesthesia: General endotracheal  Specimens: Gallbladder  Indications: Mr. Bastin is a 76 year old male who presented with diarrhea and upper abdominal pain.  CT and ultrasound were concerning for acute cholecystitis as well as thickening of the adjacent colon.  A HIDA scan confirmed a diagnosis of acute cholecystitis.  The patient's WBC is significantly increased today.  Cholecystectomy was recommended.  Findings: Gangrenous cholecystitis.  Unable to safely obtain a critical view of safety due to adherence of the duodenum and colon, thus a subtotal fenestrated cholecystectomy was performed  Procedure details: Informed consent was obtained in the preoperative area prior to the procedure. The patient was brought to the operating room and placed on the table in the supine position.  General anesthesia was induced and appropriate lines and drains were placed for intraoperative monitoring. Perioperative antibiotics were administered per SCIP guidelines. The abdomen was prepped and draped in the usual sterile fashion. A pre-procedure timeout was taken verifying patient identity, surgical site and procedure to be performed.  A small supraumbilical skin incision was made, the subcutaneous tissue was divided with cautery.  The umbilical stalk was grasped and elevated and the fascia was incised sharply.  The peritoneal cavity was visualized and a 12 mm Hassan trocar was placed.  The abdomen was inspected with no evidence of visceral vascular injury.  Three 5 mm ports were placed in the right subcostal margin all under direct visualization.  The liver was not initially visible because the colon and omentum were adherent to the  anterior surface of the liver.  The omentum was grasped and gently retracted caudad.  The colon was adherent to the gallbladder but was able to be separated easily using gentle blunt dissection.  The gallbladder appeared very distended and gangrenous.  On grasping the fundus of the gallbladder the wall tore, and a laparoscopic suction device was used to decompress the lumen of the gallbladder.  The colon and what appeared to be the duodenum were adherent to the infundibulum of the gallbladder.  The colon and omentum were gently separated from the gallbladder laterally using blunt dissection.  More medially, the adhesions were very dense and it did not appear that the duodenum could be separated from the gallbladder without significant risk of injury.  It was also immediately clear that a critical view not be safely obtained as there was dense inflammatory tissue in the cystic triangle.  Thus the decision was made to proceed with a subtotal cholecystectomy.  The anterior wall of the gallbladder was opened with cautery.  The anterior wall was then removed with cautery starting inferiorly near the infundibulum and working superiorly toward the fundus.  As much wall was removed as possible without risking injury medially to the duodenum and portal structures.  Once the entire anterior wall was removed, multiple large stones were visible within the gallbladder.  The subxiphoid port was upsized to an 11 mm port to accommodate the stone grasper.  The stones were then removed from the lumen and placed in an Endo Catch bag with the gallbladder wall.  The cystic duct orifice then became visible and there was a small amount of bile visible emanating from the cystic duct orifice.  The cystic duct orifice was closed using a 0 V-Loc figure-of-eight suture.  The mucosal surface of  the posterior wall of the gallbladder was then fulgurated with cautery.  The right upper quadrant was copiously irrigated with saline and appeared  hemostatic.  Arista was applied to the surgical site.  A 19 [place] JP drain was placed through the right lateral port site adjacent to the remnant gallbladder.  The drain was secured to the skin with a 2-0 nylon suture.  The ports were then removed under direct visualization.  The fascia at the 11 mm 12 mm port was closed with 0 Vicryl figure-of-eight sutures.  The skin at all port sites was closed with subcuticular 4-0 Monocryl suture.  Dermabond was applied.  The patient tolerated the procedure well with no apparent complications.  All counts were correct x2 at the end of the procedure. The patient was extubated and taken to PACU in stable condition.  Michaelle Birks, MD 04/05/22 6:54 PM

## 2022-04-05 NOTE — Progress Notes (Signed)
  Progress Note Patient: Arthur Shaw:[id number] DOB: [birth year] DOA: 04/04/2022  DOS: the patient was seen and examined on 04/05/2022  Brief hospital course: 76 year old male with past medical history of CAD SP PCI, HTN, HLD, obesity SP gastric bypass surgery now presents to the hospital with complaints of chest pain.  Found to have acute cholecystitis.  General surgery was consulted.  Underwent laparoscopic cholecystectomy on 04/05/2022.  Currently has a drain. Currently has a drain. Assessment and Plan: Acute cholecystitis. Possible colitis. Presents to the hospital with complaints of chest pain. Work-up showed that patient had severely elevated bilirubin. CT abdomen shows evidence of cholelithiasis as well as gallbladder distention. HIDA scan positive for cholecystitis as well. Underwent right upper quadrant ultrasound which did not show any CBD dilation. General surgery was contemplating timing of the lap chole given his history of being on Plavix. Due to worsening leukocytosis patient was taken to the OR urgently on 7/4. Underwent subtotal cholecystectomy. Currently on clear liquid diet. Has a drain in place. Pain control per family.  CAD.  SP PCI HTN. HLD. Troponin negative. Initially presented with chest pain.  Does not appear to be cardiac in nature. No further work-up for now. In fact his Plavix will be on hold. Due to soft blood pressure and currently holding all antihypertensive regimen. Due to elevated LFT statin currently on hold.  Cognitive impairment. Monitor with  Hypokalemia. Replaced.  Obesity. Prior history of gastric bypass. Patient has lost significant amount of weight. We will continue to monitor weight  Subjective: No nausea no vomiting.  Seen after the surgery.  Drowsy.  No abdominal pain.  Passing gas.  Physical Exam: Vitals:   04/05/22 0759 04/05/22 1648 04/05/22 1700 04/05/22 1715  BP: 119/63 140/67 (!) 145/72 134/67  Pulse: 71 95 98 96   Resp: (!) '21 17 14 13  '$ Temp: 99.6 F (37.6 C) 97.8 F (36.6 C)  98.1 F (36.7 C)  TempSrc: Oral     SpO2: 95% 97% 93% 93%   General: Appear in mild distress; no visible Abnormal Neck Mass Or lumps, Conjunctiva normal Cardiovascular: S1 and S2 Present, no Murmur, Respiratory: good respiratory effort, Bilateral Air entry present and CTA, no Crackles, no wheezes Abdomen: Bowel Sound present, diffusely tender Extremities: no Pedal edema Neurology: alert and oriented to time, place, and person  Gait not checked due to patient safety concerns   Data Reviewed: I have Reviewed nursing notes, Vitals, and Lab results since pt's last encounter. Pertinent lab results CBC and BMP I have ordered test including CBC and CMP I have discussed pt's care plan and test results with general surgery.   Family Communication: Wife at bedside  Disposition: Status is: Inpatient Remains inpatient appropriate because: Need for postop recovery. Author: Berle Mull, MD 04/05/2022 7:39 PM  Please look on [email or web address] to find out who is on call.

## 2022-04-06 ENCOUNTER — Encounter (HOSPITAL_COMMUNITY): Payer: Self-pay | Admitting: Surgery

## 2022-04-06 DIAGNOSIS — K8000 Calculus of gallbladder with acute cholecystitis without obstruction: Secondary | ICD-10-CM | POA: Diagnosis not present

## 2022-04-06 LAB — MAGNESIUM: Magnesium: 1.6 mg/dL — ABNORMAL LOW (ref 1.7–2.4)

## 2022-04-06 LAB — GLUCOSE, CAPILLARY
Glucose-Capillary: 122 mg/dL — ABNORMAL HIGH (ref 70–99)
Glucose-Capillary: 147 mg/dL — ABNORMAL HIGH (ref 70–99)
Glucose-Capillary: 152 mg/dL — ABNORMAL HIGH (ref 70–99)
Glucose-Capillary: 156 mg/dL — ABNORMAL HIGH (ref 70–99)
Glucose-Capillary: 161 mg/dL — ABNORMAL HIGH (ref 70–99)
Glucose-Capillary: 209 mg/dL — ABNORMAL HIGH (ref 70–99)

## 2022-04-06 LAB — POCT I-STAT, CHEM 8
BUN: 16 mg/dL (ref 8–23)
Calcium, Ion: 1.15 mmol/L (ref 1.15–1.40)
Chloride: 103 mmol/L (ref 98–111)
Creatinine, Ser: 0.7 mg/dL (ref 0.61–1.24)
Glucose, Bld: 121 mg/dL — ABNORMAL HIGH (ref 70–99)
HCT: 41 % (ref 39.0–52.0)
Hemoglobin: 13.9 g/dL (ref 13.0–17.0)
Potassium: 3 mmol/L — ABNORMAL LOW (ref 3.5–5.1)
Sodium: 138 mmol/L (ref 135–145)
TCO2: 20 mmol/L — ABNORMAL LOW (ref 22–32)

## 2022-04-06 LAB — CBC
HCT: 36 % — ABNORMAL LOW (ref 39.0–52.0)
Hemoglobin: 12.5 g/dL — ABNORMAL LOW (ref 13.0–17.0)
MCH: 29.8 pg (ref 26.0–34.0)
MCHC: 34.7 g/dL (ref 30.0–36.0)
MCV: 85.7 fL (ref 80.0–100.0)
Platelets: 194 10*3/uL (ref 150–400)
RBC: 4.2 MIL/uL — ABNORMAL LOW (ref 4.22–5.81)
RDW: 15.9 % — ABNORMAL HIGH (ref 11.5–15.5)
WBC: 15.3 10*3/uL — ABNORMAL HIGH (ref 4.0–10.5)
nRBC: 0 % (ref 0.0–0.2)

## 2022-04-06 LAB — BASIC METABOLIC PANEL
Anion gap: 11 (ref 5–15)
BUN: 15 mg/dL (ref 8–23)
CO2: 21 mmol/L — ABNORMAL LOW (ref 22–32)
Calcium: 7.9 mg/dL — ABNORMAL LOW (ref 8.9–10.3)
Chloride: 103 mmol/L (ref 98–111)
Creatinine, Ser: 0.76 mg/dL (ref 0.61–1.24)
GFR, Estimated: >60 mL/min (ref >60)
Glucose, Bld: 178 mg/dL — ABNORMAL HIGH (ref 70–99)
Potassium: 3 mmol/L — ABNORMAL LOW (ref 3.5–5.1)
Sodium: 135 mmol/L (ref 135–145)

## 2022-04-06 MED ORDER — POTASSIUM CHLORIDE CRYS ER 20 MEQ PO TBCR
40.0000 meq | EXTENDED_RELEASE_TABLET | ORAL | Status: AC
  Administered 2022-04-06 (×2): 40 meq via ORAL
  Filled 2022-04-06: qty 2

## 2022-04-06 MED ORDER — MAGNESIUM SULFATE 2 GM/50ML IV SOLN
2.0000 g | Freq: Once | INTRAVENOUS | Status: AC
  Administered 2022-04-06: 2 g via INTRAVENOUS
  Filled 2022-04-06: qty 50

## 2022-04-06 MED ORDER — PANTOPRAZOLE SODIUM 40 MG PO TBEC
40.0000 mg | DELAYED_RELEASE_TABLET | Freq: Every day | ORAL | Status: DC
  Administered 2022-04-06: 40 mg via ORAL
  Filled 2022-04-06: qty 1

## 2022-04-06 NOTE — Plan of Care (Signed)

## 2022-04-06 NOTE — Progress Notes (Signed)
1 Day Post-Op  Subjective: Afebrile, vitals stable. Hgb stable. Endorses some pain with movement. Tolerating clears, no nausea/vomiting.   Objective: Vital signs in last 24 hours: Temp:  [97.8 F (36.6 C)-99.6 F (37.6 C)] 98.3 F (36.8 C) (07/05 0405) Pulse Rate:  [71-98] 72 (07/05 0405) Resp:  [13-21] 14 (07/05 0405) BP: (112-145)/(63-77) 121/77 (07/05 0405) SpO2:  [93 %-97 %] 95 % (07/05 0405) Last BM Date : 04/05/22  Intake/Output from previous day: 07/04 0701 - 07/05 0700 In: 1760 [P.O.:360; I.V.:1400] Out: 502 [Urine:200; Drains:302] Intake/Output this shift: Total I/O In: 360 [P.O.:360] Out: 470 [Urine:200; Drains:270]  PE: General: resting comfortably, NAD Neuro: alert and oriented, no focal deficits Resp: normal work of breathing Abdomen: soft, nondistended, nontender to palpation. Incisions clean and dry. RUQ JP with sanguinous drainage with bilious tinge. Extremities: warm and well-perfused   Lab Results:  Recent Labs    04/05/22 0036 04/05/22 1459 04/06/22 0021  WBC 17.5*  --  15.3*  HGB 13.6 13.9 12.5*  HCT 39.7 41.0 36.0*  PLT 216  --  194   BMET Recent Labs    04/05/22 1750 04/06/22 0021  NA 137 135  K 3.7 3.0*  CL 105 103  CO2 20* 21*  GLUCOSE 176* 178*  BUN 17 15  CREATININE 0.99 0.76  CALCIUM 8.1* 7.9*   PT/INR No results for input(s): "LABPROT", "INR" in the last 72 hours. CMP     Component Value Date/Time   NA 135 04/06/2022 0021   NA 137 12/14/2017 0000   K 3.0 (L) 04/06/2022 0021   CL 103 04/06/2022 0021   CO2 21 (L) 04/06/2022 0021   GLUCOSE 178 (H) 04/06/2022 0021   BUN 15 04/06/2022 0021   BUN 11 12/14/2017 0000   CREATININE 0.76 04/06/2022 0021   CALCIUM 7.9 (L) 04/06/2022 0021   PROT 5.6 (L) 04/05/2022 0036   PROT 6.8 12/14/2017 0000   ALBUMIN 2.7 (L) 04/05/2022 0036   ALBUMIN 4.0 12/14/2017 0000   AST 18 04/05/2022 0036   ALT 13 04/05/2022 0036   ALKPHOS 57 04/05/2022 0036   BILITOT 1.5 (H) 04/05/2022  0036   BILITOT 0.5 12/14/2017 0000   GFRNONAA >60 04/06/2022 0021   GFRAA 109 12/14/2017 0000   Lipase     Component Value Date/Time   LIPASE 23 04/04/2022 0420       Studies/Results: NM Hepatobiliary Liver Func  Result Date: 04/04/2022 CLINICAL DATA:  Concern for acute cholecystitis. Abnormal ultrasound and CT same day. EXAM: NUCLEAR MEDICINE HEPATOBILIARY IMAGING TECHNIQUE: Sequential images of the abdomen were obtained out to 60 minutes following intravenous administration of radiopharmaceutical. RADIOPHARMACEUTICALS:  5.0 mCi Tc-50m Choletec IV COMPARISON:  Ultrasound and CT 04/04/2022 FINDINGS: Prompt clearance radiotracer from blood pool and homogeneous uptake in liver. Counts are evident the common bile duct by 15 minutes. Counts enter the small bowel by 25 minutes. Gallbladder not identified at 1 hour. 3 mg IV morphine were administered to augment filling of the gallbladder. No filling of the gallbladder after morphine augmentation. IMPRESSION: 1. Non filling of the gallbladder is consistent with ACUTE CHOLECYSTITIS. 2. Patent cystic duct. These results will be called to the ordering clinician or representative by the Radiologist Assistant, and communication documented in the PACS or [employer] Electronically Signed   By: SSuzy BouchardM.D.   On: 04/04/2022 16:57    Anti-infectives: Anti-infectives (From admission, onward)    Start     Dose/Rate Route Frequency Ordered Stop  04/04/22 1700  piperacillin-tazobactam (ZOSYN) IVPB 3.375 g  Status:  Discontinued       See Hyperspace for full Linked Orders Report.   3.375 g 12.5 mL/hr over 240 Minutes Intravenous Every 8 hours 04/04/22 1059 04/05/22 1653   04/04/22 1100  piperacillin-tazobactam (ZOSYN) IVPB 3.375 g       See Hyperspace for full Linked Orders Report.   3.375 g 100 mL/hr over 30 Minutes Intravenous  Once 04/04/22 1059 04/04/22 1201   04/04/22 0730  cefTRIAXone (ROCEPHIN) 2 g in sodium chloride 0.9 % 100 mL  IVPB        2 g 200 mL/hr over 30 Minutes Intravenous  Once 04/04/22 0722 04/04/22 0810        Assessment/Plan 76 yo male with gangrenous cholecystitis, POD1 s/p lap subtotal fenestrating cholecystectomy. - Patient is at very high risk for a bile leak. Drain is to remain in place at discharge. Output appears slightly bilious this morning. Monitor output, if persistent bile leak with high volume, patient will need an ERCP. - Advance to regular diet - Keep holding Plavix for now    LOS: 2 days    Michaelle Birks, MD [facility] Surgery General, Hepatobiliary and Pancreatic Surgery 04/06/22 6:54 AM

## 2022-04-06 NOTE — Progress Notes (Signed)
Progress Note    Arthur Shaw   UGQ:[id number]  DOB: 08-20-1946  DOA: 04/04/2022     2 PCP: Arthur Manes, MD  Initial CC: chest pain  Hospital Course: Arthur Shaw is a 76 year old male with PMH CAD s/p PCI, HTN, HLD, obesity s/p gastric bypass surgery who presented to the hospital with complaints of chest pain.  Found to have acute cholecystitis on workup.  He underwent lap chole on 04/05/22.  He remains high risk for bile leak and therefore biliary drain remained in place.  Interval History:  Resting in bed comfortably when seen this morning.  Wife present bedside as well.  Update given and questions answered.  Assessment and Plan: * Acute cholecystitis due to biliary calculus - s/p lap chole on 04/05/22 - drain to remain in place per surgery until follow up - being monitored for persistent bile leak; continue monitoring output    CAD.  SP PCI HTN. HLD. Troponin negative. Initially presented with chest pain.  Does not appear to be cardiac in nature. No further work-up for now. -Will resume Plavix as able Due to soft blood pressure and currently holding all antihypertensive regimen. Due to elevated LFT statin currently on hold.   Cognitive impairment. Monitoring  Hypokalemia. Replaced.  Obesity. Prior history of gastric bypass. Patient has lost significant amount of weight. We will continue to monitor weight     Old records reviewed in assessment of this patient  Antimicrobials:   DVT prophylaxis:  SCD's Start: 04/05/22 1734 SCDs Start: 04/04/22 0952   Code Status:   Code Status: Full Code  Mobility Assessment (last 72 hours)     Mobility Assessment     Row Name 04/06/22 0757 04/05/22 2250 04/05/22 0730 04/04/22 2010 04/04/22 1840   Does patient have an order for bedrest or is patient medically unstable No - Continue assessment No - Continue assessment No - Continue assessment No - Continue assessment No - Continue assessment   What is the highest  level of mobility based on the progressive mobility assessment? Level 5 (Walks with assist in room/hall) - Balance while stepping forward/back and can walk in room with assist - Complete Level 5 (Walks with assist in room/hall) - Balance while stepping forward/back and can walk in room with assist - Complete Level 5 (Walks with assist in room/hall) - Balance while stepping forward/back and can walk in room with assist - Complete Level 5 (Walks with assist in room/hall) - Balance while stepping forward/back and can walk in room with assist - Complete Level 5 (Walks with assist in room/hall) - Balance while stepping forward/back and can walk in room with assist - Complete            Disposition Plan:  Home 1-2days Status is: Inpt  Objective: Blood pressure 126/75, pulse 88, temperature 98.4 F (36.9 C), temperature source Oral, resp. rate 16, SpO2 95 %.  Examination:  Physical Exam Constitutional:      General: He is not in acute distress.    Appearance: Normal appearance.  HENT:     Head: Normocephalic and atraumatic.     Mouth/Throat:     Mouth: Mucous membranes are moist.  Eyes:     Extraocular Movements: Extraocular movements intact.  Cardiovascular:     Rate and Rhythm: Normal rate and regular rhythm.     Heart sounds: Normal heart sounds.  Pulmonary:     Effort: Pulmonary effort is normal. No respiratory distress.     Breath sounds: Normal breath sounds.  No wheezing.  Abdominal:     General: Bowel sounds are normal.     Palpations: Abdomen is soft.     Comments: Right sided drain in place  Musculoskeletal:        General: Normal range of motion.     Cervical back: Normal range of motion and neck supple.  Skin:    General: Skin is warm and dry.  Neurological:     General: No focal deficit present.     Mental Status: He is alert.  Psychiatric:        Mood and Affect: Mood normal.        Behavior: Behavior normal.      Consultants:  Surgery  Procedures:  Lap  chole, 04/05/22  Data Reviewed: Results for orders placed or performed during the hospital encounter of 04/04/22 (from the past 24 hour(s))  I-STAT, chem 8     Status: Abnormal   Collection Time: 04/05/22  2:59 PM  Result Value Ref Range   Sodium 138 135 - 145 mmol/L   Potassium 3.0 (L) 3.5 - 5.1 mmol/L   Chloride 103 98 - 111 mmol/L   BUN 16 8 - 23 mg/dL   Creatinine, Ser 0.70 0.61 - 1.24 mg/dL   Glucose, Bld 121 (H) 70 - 99 mg/dL   Calcium, Ion 1.15 1.15 - 1.40 mmol/L   TCO2 20 (L) 22 - 32 mmol/L   Hemoglobin 13.9 13.0 - 17.0 g/dL   HCT 41.0 39.0 - 15.1 %  Basic metabolic panel     Status: Abnormal   Collection Time: 04/05/22  5:50 PM  Result Value Ref Range   Sodium 137 135 - 145 mmol/L   Potassium 3.7 3.5 - 5.1 mmol/L   Chloride 105 98 - 111 mmol/L   CO2 20 (L) 22 - 32 mmol/L   Glucose, Bld 176 (H) 70 - 99 mg/dL   BUN 17 8 - 23 mg/dL   Creatinine, Ser 0.99 0.61 - 1.24 mg/dL   Calcium 8.1 (L) 8.9 - 10.3 mg/dL   GFR, Estimated >60 >60 mL/min   Anion gap 12 5 - 15  Magnesium     Status: None   Collection Time: 04/05/22  5:50 PM  Result Value Ref Range   Magnesium 1.7 1.7 - 2.4 mg/dL  Glucose, capillary     Status: Abnormal   Collection Time: 04/05/22  7:51 PM  Result Value Ref Range   Glucose-Capillary 200 (H) 70 - 99 mg/dL  Glucose, capillary     Status: Abnormal   Collection Time: 04/06/22 12:07 AM  Result Value Ref Range   Glucose-Capillary 209 (H) 70 - 99 mg/dL  Basic metabolic panel     Status: Abnormal   Collection Time: 04/06/22 12:21 AM  Result Value Ref Range   Sodium 135 135 - 145 mmol/L   Potassium 3.0 (L) 3.5 - 5.1 mmol/L   Chloride 103 98 - 111 mmol/L   CO2 21 (L) 22 - 32 mmol/L   Glucose, Bld 178 (H) 70 - 99 mg/dL   BUN 15 8 - 23 mg/dL   Creatinine, Ser 0.76 0.61 - 1.24 mg/dL   Calcium 7.9 (L) 8.9 - 10.3 mg/dL   GFR, Estimated >60 >60 mL/min   Anion gap 11 5 - 15  CBC     Status: Abnormal   Collection Time: 04/06/22 12:21 AM  Result Value Ref  Range   WBC 15.3 (H) 4.0 - 10.5 K/uL   RBC 4.20 (L) 4.22 - 5.81 MIL/uL  Hemoglobin 12.5 (L) 13.0 - 17.0 g/dL   HCT 36.0 (L) 39.0 - 52.0 %   MCV 85.7 80.0 - 100.0 fL   MCH 29.8 26.0 - 34.0 pg   MCHC 34.7 30.0 - 36.0 g/dL   RDW 15.9 (H) 11.5 - 15.5 %   Platelets 194 150 - 400 K/uL   nRBC 0.0 0.0 - 0.2 %  Magnesium     Status: Abnormal   Collection Time: 04/06/22 12:21 AM  Result Value Ref Range   Magnesium 1.6 (L) 1.7 - 2.4 mg/dL  Glucose, capillary     Status: Abnormal   Collection Time: 04/06/22  4:04 AM  Result Value Ref Range   Glucose-Capillary 147 (H) 70 - 99 mg/dL  Glucose, capillary     Status: Abnormal   Collection Time: 04/06/22  7:26 AM  Result Value Ref Range   Glucose-Capillary 156 (H) 70 - 99 mg/dL  Glucose, capillary     Status: Abnormal   Collection Time: 04/06/22 12:29 PM  Result Value Ref Range   Glucose-Capillary 152 (H) 70 - 99 mg/dL    I have Reviewed nursing notes, Vitals, and Lab results since pt's last encounter. Pertinent lab results : see above I have ordered test including BMP, CBC, Mg I have reviewed the last note from staff over past 24 hours I have discussed pt's care plan and test results with nursing staff, case manager   LOS: 2 days   Dwyane Dee, MD [facility] 04/06/2022, 2:46 PM

## 2022-04-06 NOTE — Assessment & Plan Note (Signed)
-   s/p lap chole on 04/05/22 - drain to remain in place per surgery until follow up - being monitored for persistent bile leak; patient has good followup with surgery at discharge

## 2022-04-07 DIAGNOSIS — K8000 Calculus of gallbladder with acute cholecystitis without obstruction: Secondary | ICD-10-CM | POA: Diagnosis not present

## 2022-04-07 LAB — MAGNESIUM: Magnesium: 2.4 mg/dL (ref 1.7–2.4)

## 2022-04-07 LAB — BASIC METABOLIC PANEL
Anion gap: 7 (ref 5–15)
BUN: 17 mg/dL (ref 8–23)
CO2: 25 mmol/L (ref 22–32)
Calcium: 8.2 mg/dL — ABNORMAL LOW (ref 8.9–10.3)
Chloride: 103 mmol/L (ref 98–111)
Creatinine, Ser: 0.85 mg/dL (ref 0.61–1.24)
GFR, Estimated: >60 mL/min (ref >60)
Glucose, Bld: 135 mg/dL — ABNORMAL HIGH (ref 70–99)
Potassium: 3.4 mmol/L — ABNORMAL LOW (ref 3.5–5.1)
Sodium: 135 mmol/L (ref 135–145)

## 2022-04-07 LAB — CBC
HCT: 36.9 % — ABNORMAL LOW (ref 39.0–52.0)
Hemoglobin: 12.8 g/dL — ABNORMAL LOW (ref 13.0–17.0)
MCH: 29.4 pg (ref 26.0–34.0)
MCHC: 34.7 g/dL (ref 30.0–36.0)
MCV: 84.6 fL (ref 80.0–100.0)
Platelets: 255 10*3/uL (ref 150–400)
RBC: 4.36 MIL/uL (ref 4.22–5.81)
RDW: 15.8 % — ABNORMAL HIGH (ref 11.5–15.5)
WBC: 16.3 10*3/uL — ABNORMAL HIGH (ref 4.0–10.5)
nRBC: 0 % (ref 0.0–0.2)

## 2022-04-07 LAB — SURGICAL PATHOLOGY

## 2022-04-07 LAB — GLUCOSE, CAPILLARY: Glucose-Capillary: 148 mg/dL — ABNORMAL HIGH (ref 70–99)

## 2022-04-07 MED ORDER — ACETAMINOPHEN-CODEINE 300-30 MG PO TABS: 1.0000 | ORAL_TABLET | Freq: Three times a day (TID) | ORAL | 0 refills | Status: AC | PRN

## 2022-04-07 MED ORDER — POTASSIUM CHLORIDE CRYS ER 20 MEQ PO TBCR: 40.0000 meq | EXTENDED_RELEASE_TABLET | Freq: Once | ORAL | Status: DC

## 2022-04-07 MED ORDER — POTASSIUM CHLORIDE CRYS ER 20 MEQ PO TBCR
40.0000 meq | EXTENDED_RELEASE_TABLET | Freq: Once | ORAL | Status: AC
Start: 2022-04-07 — End: 2022-04-07
  Administered 2022-04-07: 40 meq via ORAL
  Filled 2022-04-07: qty 2

## 2022-04-07 NOTE — Care Management Important Message (Signed)
Important Message  Patient Details  Name: Arthur Shaw MRN: [id number] Date of Birth: [birth year]   Medicare Important Message Given:        Orbie Pyo 04/07/2022, 3:48 PM

## 2022-04-07 NOTE — Discharge Summary (Signed)
Physician Discharge Summary   Arthur Shaw WVP:[id number] DOB: Oct 29, 1945 DOA: 04/04/2022  PCP: Arthur Manes, MD  Admit date: 04/04/2022 Discharge date: 04/07/2022   Admitted From: Home Disposition:  Home Discharging physician: Arthur Dee, MD  Recommendations for Outpatient Follow-up:  Follow up with surgery  Home Health:  Equipment/Devices:   Discharge Condition: stable CODE STATUS: Full Diet recommendation:  Diet Orders (From admission, onward)     Start     Ordered   04/07/22 0000  Diet - low sodium heart healthy        04/07/22 1203            Hospital Course: Mr.Arthur Shaw is a 77 year old male with PMH CAD s/p PCI, HTN, HLD, obesity s/p gastric bypass surgery who presented to the hospital with complaints of chest pain.  Found to have acute cholecystitis on workup.  He underwent lap chole on 04/05/22.  He remains high risk for bile leak and therefore biliary drain remained in place.  Assessment and Plan: * Acute cholecystitis due to biliary calculus - s/p lap chole on 04/05/22 - drain to remain in place per surgery until follow up - being monitored for persistent bile leak; patient has good followup with surgery at discharge    CAD.  SP PCI HTN. HLD. Troponin negative. Initially presented with chest pain.  Does not appear to be cardiac in nature. No further work-up for now. -resume plavix    Cognitive impairment. Monitoring  Hypokalemia. Replaced.  Obesity. Prior history of gastric bypass. Patient has lost significant amount of weight    The patient's chronic medical conditions were treated accordingly per the patient's home medication regimen except as noted.  On day of discharge, patient was felt deemed stable for discharge. Patient/family member advised to call PCP or come back to ER if needed.   Principal Diagnosis: Acute cholecystitis due to biliary calculus  Discharge Diagnoses: Active Hospital Problems   Diagnosis Date Noted   Acute  cholecystitis due to biliary calculus 04/04/2022    Priority: 1.   Obesity 05/02/2014   Coronary atherosclerosis of native coronary artery 04/24/2014   Hypertension    Hyperlipidemia     Resolved Hospital Problems  No resolved problems to display.     Discharge Instructions     Change dressing (specify)   Complete by: As directed    Dressing change: 1 time per day. Keep dry if taking shower   Diet - low sodium heart healthy   Complete by: As directed    Increase activity slowly   Complete by: As directed       Allergies as of 04/07/2022       Reactions   Statins    Other reaction(s): Other (See Comments) memory fatigue   Welchol [colesevelam Hcl]    Muscle aches        Medication List     STOP taking these medications    mometasone 50 MCG/ACT nasal spray Commonly known as: NASONEX   nitroGLYCERIN 0.4 MG SL tablet Commonly known as: NITROSTAT       TAKE these medications    acetaminophen-codeine 300-30 MG tablet Commonly known as: TYLENOL #3 Take 1 tablet by mouth every 8 (eight) hours as needed for moderate pain.   amLODipine 5 MG tablet Commonly known as: NORVASC Take 2.5 mg by mouth daily.   benazepril 40 MG tablet Commonly known as: LOTENSIN Take 40 mg by mouth daily.   cetirizine 10 MG tablet Commonly known as: ZYRTEC Take 10 mg  by mouth daily as needed for allergies.   Cholecalciferol 50 MCG (2000 UT) Tabs Take 2,000 Units by mouth daily.   clopidogrel 75 MG tablet Commonly known as: PLAVIX Take 75 mg by mouth daily.   cyclobenzaprine 10 MG tablet Commonly known as: FLEXERIL Take 10 mg by mouth 3 (three) times daily as needed.   eszopiclone 2 MG Tabs tablet Commonly known as: LUNESTA Take 2 mg by mouth at bedtime.   ezetimibe 10 MG tablet Commonly known as: ZETIA Take 10 mg by mouth daily.   lansoprazole 30 MG capsule Commonly known as: PREVACID Take 30 mg by mouth daily.   MELATONIN PO Take 1 tablet by mouth at bedtime.    metoprolol tartrate 50 MG tablet Commonly known as: LOPRESSOR Take 75 mg by mouth 2 (two) times daily.   rosuvastatin 10 MG tablet Commonly known as: Crestor Take 1 tablet (10 mg total) by mouth daily. What changed: when to take this   Stool Softener 100 MG capsule Generic drug: docusate sodium Take 100 mg by mouth daily as needed for mild constipation.   triamcinolone lotion 0.1 % Commonly known as: KENALOG Apply 1 Application topically daily as needed for rash.               Discharge Care Instructions  (From admission, onward)           Start     Ordered   04/07/22 0000  Change dressing (specify)       Comments: Dressing change: 1 time per day. Keep dry if taking shower   04/07/22 1203            Allergies  Allergen Reactions   Statins     Other reaction(s): Other (See Comments) memory fatigue   Welchol [Colesevelam Hcl]     Muscle aches    Consultations: General surgery  Procedures: Lap chole, 04/05/22   Discharge Exam: BP 133/78 (BP Location: Left Arm)   Pulse 98   Temp 98.3 F (36.8 C) (Oral)   Resp 16   SpO2 94%  Physical Exam Constitutional:      General: He is not in acute distress.    Appearance: Normal appearance.  HENT:     Head: Normocephalic and atraumatic.     Mouth/Throat:     Mouth: Mucous membranes are moist.  Eyes:     Extraocular Movements: Extraocular movements intact.  Cardiovascular:     Rate and Rhythm: Normal rate and regular rhythm.     Heart sounds: Normal heart sounds.  Pulmonary:     Effort: Pulmonary effort is normal. No respiratory distress.     Breath sounds: Normal breath sounds. No wheezing.  Abdominal:     General: Bowel sounds are normal.     Palpations: Abdomen is soft.     Comments: Right sided drain in place  Musculoskeletal:        General: Normal range of motion.     Cervical back: Normal range of motion and neck supple.  Skin:    General: Skin is warm and dry.  Neurological:      General: No focal deficit present.     Mental Status: He is alert.  Psychiatric:        Mood and Affect: Mood normal.        Behavior: Behavior normal.      The results of significant diagnostics from this hospitalization (including imaging, microbiology, ancillary and laboratory) are listed below for reference.   Microbiology: No results  found for this or any previous visit (from the past 240 hour(s)).   Labs: BNP (last 3 results) No results for input(s): "BNP" in the last 8760 hours. Basic Metabolic Panel: Recent Labs  Lab 04/04/22 0220 04/05/22 0036 04/05/22 1459 04/05/22 1750 04/06/22 0021 04/07/22 0040  NA 135 137 138 137 135 135  K 3.0* 2.6* 3.0* 3.7 3.0* 3.4*  CL 103 104 103 105 103 103  CO2 21* 22  --  20* 21* 25  GLUCOSE 176* 123* 121* 176* 178* 135*  BUN '11 11 16 17 15 17  '$ CREATININE 1.12 0.96 0.70 0.99 0.76 0.85  CALCIUM 8.6* 8.1*  --  8.1* 7.9* 8.2*  MG  --  1.7  --  1.7 1.6* 2.4   Liver Function Tests: Recent Labs  Lab 04/04/22 0420 04/05/22 0036  AST 20 18  ALT 13 13  ALKPHOS 72 57  BILITOT 1.7* 1.5*  PROT 7.2 5.6*  ALBUMIN 3.6 2.7*   Recent Labs  Lab 04/04/22 0420  LIPASE 23   No results for input(s): "AMMONIA" in the last 168 hours. CBC: Recent Labs  Lab 04/04/22 0220 04/05/22 0036 04/05/22 1459 04/06/22 0021 04/07/22 0040  WBC 8.6 17.5*  --  15.3* 16.3*  HGB 15.4 13.6 13.9 12.5* 12.8*  HCT 44.3 39.7 41.0 36.0* 36.9*  MCV 85.4 85.6  --  85.7 84.6  PLT 274 216  --  194 255   Cardiac Enzymes: No results for input(s): "CKTOTAL", "CKMB", "CKMBINDEX", "TROPONINI" in the last 168 hours. BNP: Invalid input(s): "POCBNP" CBG: Recent Labs  Lab 04/06/22 0726 04/06/22 1229 04/06/22 1829 04/06/22 1959 04/07/22 0810  GLUCAP 156* 152* 122* 161* 148*   D-Dimer No results for input(s): "DDIMER" in the last 72 hours. Hgb A1c No results for input(s): "HGBA1C" in the last 72 hours. Lipid Profile No results for input(s): "CHOL", "HDL",  "LDLCALC", "TRIG", "CHOLHDL", "LDLDIRECT" in the last 72 hours. Thyroid function studies No results for input(s): "TSH", "T4TOTAL", "T3FREE", "THYROIDAB" in the last 72 hours.  Invalid input(s): "FREET3" Anemia work up No results for input(s): "VITAMINB12", "FOLATE", "FERRITIN", "TIBC", "IRON", "RETICCTPCT" in the last 72 hours. Urinalysis    Component Value Date/Time   COLORURINE YELLOW 07/12/2007 0306   APPEARANCEUR Clear 12/15/2017 1000   LABSPEC 1.010 07/12/2007 0306   PHURINE 6.5 07/12/2007 0306   GLUCOSEU Negative 12/15/2017 1000   HGBUR NEGATIVE 07/12/2007 0306   BILIRUBINUR Negative 12/15/2017 1000   KETONESUR NEGATIVE 07/12/2007 0306   PROTEINUR Negative 12/15/2017 1000   PROTEINUR NEGATIVE 07/12/2007 0306   UROBILINOGEN 0.2 07/12/2007 0306   NITRITE Negative 12/15/2017 1000   NITRITE NEGATIVE 07/12/2007 0306   LEUKOCYTESUR Negative 12/15/2017 1000   Sepsis Labs Recent Labs  Lab 04/04/22 0220 04/05/22 0036 04/06/22 0021 04/07/22 0040  WBC 8.6 17.5* 15.3* 16.3*   Microbiology No results found for this or any previous visit (from the past 240 hour(s)).  Procedures/Studies: NM Hepatobiliary Liver Func  Result Date: 04/04/2022 CLINICAL DATA:  Concern for acute cholecystitis. Abnormal ultrasound and CT same day. EXAM: NUCLEAR MEDICINE HEPATOBILIARY IMAGING TECHNIQUE: Sequential images of the abdomen were obtained out to 60 minutes following intravenous administration of radiopharmaceutical. RADIOPHARMACEUTICALS:  5.0 mCi Tc-58m Choletec IV COMPARISON:  Ultrasound and CT 04/04/2022 FINDINGS: Prompt clearance radiotracer from blood pool and homogeneous uptake in liver. Counts are evident the common bile duct by 15 minutes. Counts enter the small bowel by 25 minutes. Gallbladder not identified at 1 hour. 3 mg IV morphine were administered to  augment filling of the gallbladder. No filling of the gallbladder after morphine augmentation. IMPRESSION: 1. Non filling of the  gallbladder is consistent with ACUTE CHOLECYSTITIS. 2. Patent cystic duct. These results will be called to the ordering clinician or representative by the Radiologist Assistant, and communication documented in the PACS or [employer]. Electronically Signed   By: Suzy Bouchard M.D.   On: 04/04/2022 16:57   US Abdomen Limited RUQ (LIVER/GB)  Result Date: 04/04/2022 CLINICAL DATA:  76 year old male with epigastric abdominal pain. EXAM: ULTRASOUND ABDOMEN LIMITED RIGHT UPPER QUADRANT COMPARISON:  CT Abdomen and Pelvis 0459 hours today. FINDINGS: Gallbladder: Distended gallbladder. Cholelithiasis better demonstrated by CT. Gallbladder wall thickening of 5-6 mm on image 3. Positive sonographic Murphy sign. No pericholecystic fluid identified. Common bile duct: Diameter: Unable to visualize by ultrasound. CBD was up to 6-7 mm on the earlier CT, somewhat tapered distally on that exam. Liver: Suboptimally visualized. Background echogenicity within normal limits (image 36). No discrete liver lesion identified. Portal vein is patent on color Doppler imaging with normal direction of blood flow towards the liver. Other: No free fluid.  Negative visible right kidney. IMPRESSION: 1. Acute Cholecystitis: distended gallbladder with wall thickening and positive sonographic Murphy sign. Cholelithiasis better demonstrated by CT today. 2. CBD poorly visualized by ultrasound. Electronically Signed   By: Genevie Ann M.D.   On: 04/04/2022 07:13   CT ABDOMEN PELVIS W CONTRAST  Result Date: 04/04/2022 CLINICAL DATA:  Abdominal pain. EXAM: CT ABDOMEN AND PELVIS WITH CONTRAST TECHNIQUE: Multidetector CT imaging of the abdomen and pelvis was performed using the standard protocol following bolus administration of intravenous contrast. RADIATION DOSE REDUCTION: This exam was performed according to the departmental dose-optimization program which includes automated exposure control, adjustment of the mA and/or kV according to patient  size and/or use of iterative reconstruction technique. CONTRAST:  131m OMNIPAQUE IOHEXOL 300 MG/ML  SOLN COMPARISON:  None Available. FINDINGS: Lower chest: No acute abnormality. There is marked asymmetric elevation of the right hemidiaphragm Hepatobiliary: No suspicious liver lesion identified. Stones identified within the gallbladder which measure up to 1.6 cm. The gallbladder appears distended and there is mild pericholecystic edema. Cannot exclude cholecystitis. No bile duct dilatation. Pancreas: Unremarkable. No pancreatic ductal dilatation or surrounding inflammatory changes. Spleen: Normal in size without focal abnormality. Adrenals/Urinary Tract: Normal right adrenal gland. Benign left adrenal gland myelolipoma measures 3.1 x 2.8 cm, image 31/3. This is compatible with a benign abnormality. No follow-up imaging is recommended. Benign appearing Bosniak class 1 left kidney cyst arises off the posterior cortex of the upper pole measuring 3.7 cm, image 42/3. No follow-up imaging recommended. No nephrolithiasis or hydronephrosis identified bilaterally. Urinary bladder is within normal limits. Stomach/Bowel: Postoperative changes are identified compatible with previous sleeve gastrectomy. Small hiatal hernia is noted. No small bowel wall thickening, inflammation or distension. The appendix is visualized and appears normal, image 52/7. There is mild pericolonic haziness and equivocal wall thickening involving the ascending colon noted. Decompressed sigmoid colon and rectum identified with mild wall thickening, nonspecific. No signs pneumatosis. No evidence to suggest bowel obstruction. Vascular/Lymphatic: Aortic atherosclerosis. No aneurysm. No signs of abdominopelvic adenopathy. Reproductive: Mild prostate gland enlargement. Other: No free fluid or fluid collections. No signs of pneumoperitoneum. Musculoskeletal: Thoracolumbar spondylosis. No acute or suspicious osseous findings. IMPRESSION: 1. Cholelithiasis  with distended gallbladder and mild pericholecystic edema. Cannot exclude cholecystitis. Consider further evaluation with right upper quadrant ultrasound. 2. Mild pericolonic haziness and equivocal wall thickening involving the ascending colon and sigmoid colon  and rectum. Recommend correlation for any clinical signs or symptoms suggestive of colitis. 3. Normal appendix. 4. Marked asymmetric elevation of the right hemidiaphragm. 5. Aortic Atherosclerosis (ICD10-I70.0). Electronically Signed   By: Kerby Moors M.D.   On: 04/04/2022 05:49   DG Chest 2 View  Result Date: 04/04/2022 CLINICAL DATA:  Chest pain for 2 days EXAM: CHEST - 2 VIEW COMPARISON:  12/06/2011 FINDINGS: Cardiac shadow is within normal limits. The lungs are well aerated bilaterally. No focal infiltrate or effusion is seen. Mild scarring is noted in the bases bilaterally. Degenerative changes of the thoracic spine are noted. IMPRESSION: No active cardiopulmonary disease. Electronically Signed   By: Inez Catalina M.D.   On: 04/04/2022 02:36     Time coordinating discharge: Over 30 minutes    Arthur Dee, MD  [facility] 04/07/2022, 6:45 PM

## 2022-04-07 NOTE — Discharge Instructions (Signed)
[facility] [place] [facility] DISCHARGE INSTRUCTIONS  Activity No heavy lifting greater than 15 pounds for 4 weeks after surgery. Ok to shower in 24 hours, but do not bathe or submerge incisions underwater. Do not drive while taking narcotic pain medication.  Wound Care Your incisions are covered with skin glue called Dermabond. This will peel off on its own over time. You may shower and allow warm soapy water to run over your incisions. Gently pat dry. Do not submerge your incision underwater. Monitor your incision for any new redness, tenderness, or drainage.  When to Call us: Fever greater than 100.5 New redness, drainage, or swelling at incision site Severe pain, nausea, or vomiting Jaundice (yellowing of the whites of the eyes or skin)  JP DRAIN CARE INSTRUCTIONS Wash your hands prior to caring for your drain. Uncap the bulb to release the suction. Pour out the bulb contents into a measuring cup and record the amount. Squeeze the bulb and replace the cap to apply suction again. You should empty your drain each time you notice the bulb is full. Empty at least once a day and more often when the bulb fills up.  Please keep a daily log of your drain output and bring this with you when you come to clinic.   Follow-up You will have a follow up appointment scheduled with Dr. Zenia Resides in about 10 days - our office will call you to set up this appointment. This will be at the [facility] Surgery office at [address] [place], [place], [place]. Please arrive at least 15 minutes prior to your scheduled appointment time.  For questions or concerns, please call the office at [phone].

## 2022-04-07 NOTE — Progress Notes (Signed)
2 Days Post-Op  Subjective: Afebrile, vitals stable. Overall feeling well, eating without difficulty. Pain controlled. Total drain output 170 in last 24 hours (70 overnight).   Objective: Vital signs in last 24 hours: Temp:  [98.3 F (36.8 C)-98.9 F (37.2 C)] 98.3 F (36.8 C) (07/06 0806) Pulse Rate:  [93-99] 98 (07/06 0806) Resp:  [15-16] 16 (07/06 0806) BP: (133-151)/(77-87) 133/78 (07/06 0806) SpO2:  [94 %-95 %] 94 % (07/06 0806) Last BM Date : 04/05/22  Intake/Output from previous day: 07/05 0701 - 07/06 0700 In: 240 [P.O.:240] Out: 170 [Drains:170] Intake/Output this shift: Total I/O In: 120 [P.O.:120] Out: -   PE: General: resting comfortably, NAD Neuro: alert and oriented, no focal deficits Resp: normal work of breathing Abdomen: soft, nondistended, nontender to palpation. Incisions clean and dry. RUQ JP with sanguinous drainage with bilious tinge. Extremities: warm and well-perfused   Lab Results:  Recent Labs    04/06/22 0021 04/07/22 0040  WBC 15.3* 16.3*  HGB 12.5* 12.8*  HCT 36.0* 36.9*  PLT 194 255   BMET Recent Labs    04/06/22 0021 04/07/22 0040  NA 135 135  K 3.0* 3.4*  CL 103 103  CO2 21* 25  GLUCOSE 178* 135*  BUN 15 17  CREATININE 0.76 0.85  CALCIUM 7.9* 8.2*   PT/INR No results for input(s): "LABPROT", "INR" in the last 72 hours. CMP     Component Value Date/Time   NA 135 04/07/2022 0040   NA 137 12/14/2017 0000   K 3.4 (L) 04/07/2022 0040   CL 103 04/07/2022 0040   CO2 25 04/07/2022 0040   GLUCOSE 135 (H) 04/07/2022 0040   BUN 17 04/07/2022 0040   BUN 11 12/14/2017 0000   CREATININE 0.85 04/07/2022 0040   CALCIUM 8.2 (L) 04/07/2022 0040   PROT 5.6 (L) 04/05/2022 0036   PROT 6.8 12/14/2017 0000   ALBUMIN 2.7 (L) 04/05/2022 0036   ALBUMIN 4.0 12/14/2017 0000   AST 18 04/05/2022 0036   ALT 13 04/05/2022 0036   ALKPHOS 57 04/05/2022 0036   BILITOT 1.5 (H) 04/05/2022 0036   BILITOT 0.5 12/14/2017 0000   GFRNONAA  >60 04/07/2022 0040   GFRAA 109 12/14/2017 0000   Lipase     Component Value Date/Time   LIPASE 23 04/04/2022 0420       Studies/Results: No results found.    Assessment/Plan 76 yo male with gangrenous cholecystitis, POD2 s/p lap subtotal fenestrating cholecystectomy. - Continue regular diet - No need for further antibiotics - Drain still has some bile in it, but is overall relatively low volume and patient has no fevers. Drain is to remain in place until the bile leak hopefully spontaneously closes. - [place] for discharge home today with drain in place. I reviewed with the patient and his wife, who is a retired Geophysicist/field seismologist, his instructions regarding drain care. They are to keep a daily log. I will call him next Monday for an update on his drain volume, and he will follow up in clinic on July 18. If he has any fevers, chills, or worsening pain before then he is to call. If he has a sudden increase in drain volume he was also instructed to call. If the volume of the bile leak increases, or if the the leak does not close spontaneously, patient will need an ERCP with stent placement. - I will arrange close follow up in the office - Written instructions have been added to the AVS  LOS: 3 days    Michaelle Birks, MD [facility] General, Hepatobiliary and Pancreatic Surgery 04/07/22 8:36 AM

## 2022-04-07 NOTE — Anesthesia Postprocedure Evaluation (Signed)
Anesthesia Post Note  Patient: Arthur Shaw  Procedure(s) Performed: LAPAROSCOPIC CHOLECYSTECTOMY (Abdomen)     Patient location during evaluation: PACU Anesthesia Type: General Level of consciousness: patient cooperative and awake Pain management: pain level controlled Vital Signs Assessment: post-procedure vital signs reviewed and stable Respiratory status: spontaneous breathing, nonlabored ventilation, respiratory function stable and patient connected to nasal cannula oxygen Cardiovascular status: blood pressure returned to baseline and stable Postop Assessment: no apparent nausea or vomiting Anesthetic complications: no   No notable events documented.  Last Vitals:  Vitals:   04/07/22 0438 04/07/22 0806  BP: (!) 147/87 133/78  Pulse: 93 98  Resp: 16 16  Temp: 36.9 C 36.8 C  SpO2: 94% 94%    Last Pain:  Vitals:   04/07/22 0806  TempSrc: Oral  PainSc:                  Leana Springston

## 2022-04-07 NOTE — Progress Notes (Signed)
Discharge instructions given with wife present at bedside. Pt transported off unit via [place] with all belongings. He remains alert and oriented at baseline. JP drain emptied and remains intact.

## 2022-04-12 DIAGNOSIS — K9189 Other postprocedural complications and disorders of digestive system: Secondary | ICD-10-CM | POA: Diagnosis not present

## 2022-04-12 DIAGNOSIS — K838 Other specified diseases of biliary tract: Secondary | ICD-10-CM | POA: Diagnosis not present

## 2022-04-25 ENCOUNTER — Other Ambulatory Visit: Payer: Self-pay | Admitting: Gastroenterology

## 2022-04-27 DIAGNOSIS — K839 Disease of biliary tract, unspecified: Secondary | ICD-10-CM | POA: Diagnosis not present

## 2022-04-29 ENCOUNTER — Ambulatory Visit (HOSPITAL_COMMUNITY): Payer: PPO | Admitting: Anesthesiology

## 2022-04-29 ENCOUNTER — Encounter (HOSPITAL_COMMUNITY)
Admission: RE | Disposition: A | Discharge status: Home / Self Care | Payer: Self-pay | Source: Home / Self Care | Attending: Gastroenterology

## 2022-04-29 ENCOUNTER — Encounter (HOSPITAL_COMMUNITY): Payer: Self-pay | Admitting: Gastroenterology

## 2022-04-29 ENCOUNTER — Ambulatory Visit (HOSPITAL_COMMUNITY): Payer: PPO

## 2022-04-29 ENCOUNTER — Ambulatory Visit (HOSPITAL_BASED_OUTPATIENT_CLINIC_OR_DEPARTMENT_OTHER): Payer: PPO | Admitting: Anesthesiology

## 2022-04-29 ENCOUNTER — Other Ambulatory Visit: Payer: Self-pay

## 2022-04-29 ENCOUNTER — Ambulatory Visit (HOSPITAL_COMMUNITY)
Admission: RE | Admit: 2022-04-29 | Discharge: 2022-04-29 | Disposition: A | Discharge status: Home / Self Care | Payer: PPO | Attending: Gastroenterology | Admitting: Gastroenterology

## 2022-04-29 DIAGNOSIS — K219 Gastro-esophageal reflux disease without esophagitis: Secondary | ICD-10-CM | POA: Diagnosis not present

## 2022-04-29 DIAGNOSIS — M797 Fibromyalgia: Secondary | ICD-10-CM | POA: Diagnosis not present

## 2022-04-29 DIAGNOSIS — I10 Essential (primary) hypertension: Secondary | ICD-10-CM | POA: Insufficient documentation

## 2022-04-29 DIAGNOSIS — K838 Other specified diseases of biliary tract: Secondary | ICD-10-CM | POA: Diagnosis not present

## 2022-04-29 DIAGNOSIS — K828 Other specified diseases of gallbladder: Secondary | ICD-10-CM | POA: Diagnosis not present

## 2022-04-29 HISTORY — PX: ENDOSCOPIC RETROGRADE CHOLANGIOPANCREATOGRAPHY (ERCP) WITH PROPOFOL: SHX5810

## 2022-04-29 HISTORY — PX: BILIARY STENT PLACEMENT: SHX5538

## 2022-04-29 HISTORY — PX: SPHINCTEROTOMY: SHX5544

## 2022-04-29 HISTORY — PX: REMOVAL OF STONES: SHX5545

## 2022-04-29 SURGERY — ENDOSCOPIC RETROGRADE CHOLANGIOPANCREATOGRAPHY (ERCP) WITH PROPOFOL
Anesthesia: General

## 2022-04-29 MED ORDER — SODIUM CHLORIDE 0.9 % IV SOLN
INTRAVENOUS | Status: DC | PRN
  Administered 2022-04-29: 15 mL

## 2022-04-29 MED ORDER — DICLOFENAC SUPPOSITORY 100 MG
RECTAL | Status: AC
  Filled 2022-04-29: qty 1

## 2022-04-29 MED ORDER — DEXAMETHASONE SODIUM PHOSPHATE 10 MG/ML IJ SOLN
INTRAMUSCULAR | Status: DC | PRN
  Administered 2022-04-29: 10 mg via INTRAVENOUS

## 2022-04-29 MED ORDER — FENTANYL CITRATE (PF) 100 MCG/2ML IJ SOLN
INTRAMUSCULAR | Status: DC | PRN
  Administered 2022-04-29: 50 ug via INTRAVENOUS

## 2022-04-29 MED ORDER — PROPOFOL 10 MG/ML IV BOLUS
INTRAVENOUS | Status: DC | PRN
  Administered 2022-04-29: 150 mg via INTRAVENOUS

## 2022-04-29 MED ORDER — EPHEDRINE SULFATE-NACL 50-0.9 MG/10ML-% IV SOSY
PREFILLED_SYRINGE | INTRAVENOUS | Status: DC | PRN
  Administered 2022-04-29: 10 mg via INTRAVENOUS

## 2022-04-29 MED ORDER — PHENYLEPHRINE 80 MCG/ML (10ML) SYRINGE FOR IV PUSH (FOR BLOOD PRESSURE SUPPORT)
PREFILLED_SYRINGE | INTRAVENOUS | Status: DC | PRN
  Administered 2022-04-29 (×2): 160 ug via INTRAVENOUS

## 2022-04-29 MED ORDER — SODIUM CHLORIDE 0.9 % IV SOLN: INTRAVENOUS | Status: DC

## 2022-04-29 MED ORDER — DICLOFENAC SUPPOSITORY 100 MG
RECTAL | Status: DC | PRN
  Administered 2022-04-29: 100 mg via RECTAL

## 2022-04-29 MED ORDER — GLUCAGON HCL RDNA (DIAGNOSTIC) 1 MG IJ SOLR
INTRAMUSCULAR | Status: AC
  Filled 2022-04-29: qty 1

## 2022-04-29 MED ORDER — CIPROFLOXACIN IN D5W 400 MG/200ML IV SOLN
INTRAVENOUS | Status: AC
  Filled 2022-04-29: qty 200

## 2022-04-29 MED ORDER — CIPROFLOXACIN IN D5W 400 MG/200ML IV SOLN
INTRAVENOUS | Status: DC | PRN
  Administered 2022-04-29: 400 mg via INTRAVENOUS

## 2022-04-29 MED ORDER — SUGAMMADEX SODIUM 200 MG/2ML IV SOLN
INTRAVENOUS | Status: DC | PRN
  Administered 2022-04-29: 250 mg via INTRAVENOUS

## 2022-04-29 MED ORDER — ONDANSETRON HCL 4 MG/2ML IJ SOLN
INTRAMUSCULAR | Status: DC | PRN
  Administered 2022-04-29: 4 mg via INTRAVENOUS

## 2022-04-29 MED ORDER — FENTANYL CITRATE (PF) 100 MCG/2ML IJ SOLN
INTRAMUSCULAR | Status: AC
  Filled 2022-04-29: qty 2

## 2022-04-29 MED ORDER — LACTATED RINGERS IV SOLN: INTRAVENOUS | Status: DC

## 2022-04-29 MED ORDER — LIDOCAINE 2% (20 MG/ML) 5 ML SYRINGE
INTRAMUSCULAR | Status: DC | PRN
  Administered 2022-04-29: 80 mg via INTRAVENOUS

## 2022-04-29 MED ORDER — ROCURONIUM BROMIDE 10 MG/ML (PF) SYRINGE
PREFILLED_SYRINGE | INTRAVENOUS | Status: DC | PRN
  Administered 2022-04-29: 50 mg via INTRAVENOUS

## 2022-04-29 NOTE — Anesthesia Postprocedure Evaluation (Signed)
Anesthesia Post Note  Patient: Arthur Shaw  Procedure(s) Performed: ENDOSCOPIC RETROGRADE CHOLANGIOPANCREATOGRAPHY (ERCP) WITH PROPOFOL BILIARY STENT PLACEMENT Balloon Sweep of CBD SPHINCTEROTOMY     Patient location during evaluation: PACU Anesthesia Type: General Level of consciousness: awake and alert Pain management: pain level controlled Vital Signs Assessment: post-procedure vital signs reviewed and stable Respiratory status: spontaneous breathing, nonlabored ventilation and respiratory function stable Cardiovascular status: blood pressure returned to baseline and stable Postop Assessment: no apparent nausea or vomiting Anesthetic complications: no   No notable events documented.  Last Vitals:  Vitals:   04/29/22 1239 04/29/22 1240  BP: (!) 117/57 (!) 117/57  Pulse: (!) 57 (!) 45  Resp: 12 12  Temp:    SpO2: 97% 98%    Last Pain:  Vitals:   04/29/22 1223  TempSrc: Oral  PainSc: 0-No pain                 Lynda Rainwater

## 2022-04-29 NOTE — Discharge Instructions (Addendum)
Clear liquid diet only until 6 PM and if doing well at that time may have soft solids and please call if significant abdominal pain fever yellow jaundice signs of GI bleeding or nausea or vomiting otherwise if doing well may resume Plavix on Sunday and call surgery in a week or 2 if the drainage is significantly decreased for possible drain removal and on day it is done stop by my office and schedule stent removal with my nurse in roughly 6 to 8 weeks and as above call sooner if any GI question or problemYOU HAD AN ENDOSCOPIC PROCEDURE TODAY: Refer to the procedure report and other information in the discharge instructions given to you for any specific questions about what was found during the examination. If this information does not answer your questions, please call [facility] office at [phone] to clarify.   YOU SHOULD EXPECT: Some feelings of bloating in the abdomen. Passage of more gas than usual. Walking can help get rid of the air that was put into your GI tract during the procedure and reduce the bloating. If you had a lower endoscopy (such as a colonoscopy or flexible sigmoidoscopy) you may notice spotting of blood in your stool or on the toilet paper. Some abdominal soreness may be present for a day or two, also.  DIET: Your first meal following the procedure should be a light meal and then it is ok to progress to your normal diet. A half-sandwich or bowl of soup is an example of a good first meal. Heavy or fried foods are harder to digest and may make you feel nauseous or bloated. Drink plenty of fluids but you should avoid alcoholic beverages for 24 hours. If you had a esophageal dilation, please see attached instructions for diet.    ACTIVITY: Your care partner should take you home directly after the procedure. You should plan to take it easy, moving slowly for the rest of the day. You can resume normal activity the day after the procedure however YOU SHOULD NOT DRIVE, use power tools,  machinery or perform tasks that involve climbing or major physical exertion for 24 hours (because of the sedation medicines used during the test).   SYMPTOMS TO REPORT IMMEDIATELY: A gastroenterologist can be reached at any hour. Please call [phone]  for any of the following symptoms:  Following lower endoscopy (colonoscopy, flexible sigmoidoscopy) Excessive amounts of blood in the stool  Significant tenderness, worsening of abdominal pains  Swelling of the abdomen that is new, acute  Fever of 100 or higher  Following upper endoscopy (EGD, EUS, ERCP, esophageal dilation) Vomiting of blood or coffee ground material  New, significant abdominal pain  New, significant chest pain or pain under the shoulder blades  Painful or persistently difficult swallowing  New shortness of breath  Black, tarry-looking or red, bloody stools  FOLLOW UP:  If any biopsies were taken you will be contacted by phone or by letter within the next 1-3 weeks. Call [phone]  if you have not heard about the biopsies in 3 weeks.  Please also call with any specific questions about appointments or follow up tests. YOU HAD AN ENDOSCOPIC PROCEDURE TODAY: Refer to the procedure report and other information in the discharge instructions given to you for any specific questions about what was found during the examination. If this information does not answer your questions, please call [facility] office at [phone] to clarify.   YOU SHOULD EXPECT: Some feelings of bloating in the abdomen. Passage of more  gas than usual. Walking can help get rid of the air that was put into your GI tract during the procedure and reduce the bloating. If you had a lower endoscopy (such as a colonoscopy or flexible sigmoidoscopy) you may notice spotting of blood in your stool or on the toilet paper. Some abdominal soreness may be present for a day or two, also.  DIET: Your first meal following the procedure should be a light meal and then it  is ok to progress to your normal diet. A half-sandwich or bowl of soup is an example of a good first meal. Heavy or fried foods are harder to digest and may make you feel nauseous or bloated. Drink plenty of fluids but you should avoid alcoholic beverages for 24 hours. If you had a esophageal dilation, please see attached instructions for diet.    ACTIVITY: Your care partner should take you home directly after the procedure. You should plan to take it easy, moving slowly for the rest of the day. You can resume normal activity the day after the procedure however YOU SHOULD NOT DRIVE, use power tools, machinery or perform tasks that involve climbing or major physical exertion for 24 hours (because of the sedation medicines used during the test).   SYMPTOMS TO REPORT IMMEDIATELY: A gastroenterologist can be reached at any hour. Please call [phone]  for any of the following symptoms:  Following lower endoscopy (colonoscopy, flexible sigmoidoscopy) Excessive amounts of blood in the stool  Significant tenderness, worsening of abdominal pains  Swelling of the abdomen that is new, acute  Fever of 100 or higher  Following upper endoscopy (EGD, EUS, ERCP, esophageal dilation) Vomiting of blood or coffee ground material  New, significant abdominal pain  New, significant chest pain or pain under the shoulder blades  Painful or persistently difficult swallowing  New shortness of breath  Black, tarry-looking or red, bloody stools  FOLLOW UP:  If any biopsies were taken you will be contacted by phone or by letter within the next 1-3 weeks. Call [phone]  if you have not heard about the biopsies in 3 weeks.  Please also call with any specific questions about appointments or follow up tests.

## 2022-04-29 NOTE — Transfer of Care (Signed)
Immediate Anesthesia Transfer of Care Note  Patient: Arthur Shaw  Procedure(s) Performed: ENDOSCOPIC RETROGRADE CHOLANGIOPANCREATOGRAPHY (ERCP) WITH PROPOFOL BILIARY STENT PLACEMENT Balloon Sweep of CBD SPHINCTEROTOMY  Patient Location: Endoscopy Unit  Anesthesia Type:General  Level of Consciousness: awake and alert   Airway & Oxygen Therapy: Patient Spontanous Breathing and Patient connected to face mask oxygen   Post-op Assessment: Report given to RN and Post -op Vital signs reviewed and stable  Post vital signs: Reviewed and stable  Last Vitals:  Vitals Value Taken Time  BP    Temp    Pulse 67 04/29/22 1222  Resp 16 04/29/22 1222  SpO2 100 % 04/29/22 1222  Vitals shown include unvalidated device data.  Last Pain:  Vitals:   04/29/22 0915  TempSrc: Oral  PainSc: 0-No pain         Complications: No notable events documented.

## 2022-04-29 NOTE — Op Note (Signed)
[facility] Patient Name: Arthur Shaw Procedure Date: 04/29/2022 MRN: [id number] Attending MD: Clarene Essex , MD Date of Birth: 1946-01-20 CSN: [id number] Age: 76 Admit Type: Outpatient Procedure:                ERCP Indications:              Bile leak Providers:                Clarene Essex, MD, Burtis Junes, RN, William Dalton,                            Technician, Darliss Cheney, Technician Referring MD:              Medicines:                General Anesthesia Complications:            No immediate complications. Estimated Blood Loss:     Estimated blood loss: none. Procedure:                Pre-Anesthesia Assessment:                           - Prior to the procedure, a History and Physical                            was performed, and patient medications and                            allergies were reviewed. The patient's tolerance of                            previous anesthesia was also reviewed. The risks                            and benefits of the procedure and the sedation                            options and risks were discussed with the patient.                            All questions were answered, and informed consent                            was obtained. Prior Anticoagulants: The patient has                            taken Plavix (clopidogrel), last dose was 4 days                            prior to procedure. ASA Grade Assessment: III - A                            patient with severe systemic disease. After  reviewing the risks and benefits, the patient was                            deemed in satisfactory condition to undergo the                            procedure.                           After obtaining informed consent, the scope was                            passed under direct vision. Throughout the                            procedure, the patient's blood pressure, pulse, and                             oxygen saturations were monitored continuously. The                            [id number]-Q190V ([id number]) Olympus duodenoscope was                            introduced through the mouth, and used to inject                            contrast into and used to cannulate the bile duct.                            The ERCP was accomplished without difficulty. The                            patient tolerated the procedure well. Scope In: Scope Out: Findings:      The major papilla was some distance away from a diverticulum. Deep       selective cannulation was readily obtained and the major papilla was       normal. On initial cholangiogram the CBD was normal and he had a long       cystic duct remnant but we did not overfill the cystic duct and did not       try to increase the pressure in the duct to tell definitively whether       there was a leak or not and we proceeded with a small to medium biliary       sphincterotomy was made with a Hydratome sphincterotome using ERBE       electrocautery. There was no post-sphincterotomy bleeding. We were able       to get the half bowed sphincterotome easily in and out of the duct and       to discover objects, the biliary tree was swept with a 9 mm balloon       starting at the bifurcation. Nothing was found on 1 balloon       pull-through. One 10 Fr by 7 cm plastic biliary stent with a single       external flap  and a single internal flap was placed 6 cm into the common       bile duct. Bile flowed through the stent. The stent was in good       position. The wire and introducer were removed the scope was removed and       the patient tolerated the procedure well there was no obvious immediate       complication and there was no pancreatic duct injection or wire       advancement throughout the procedure Impression:               - The major papilla was some distance away from a                            diverticulum.                           - The  major papilla appeared normal.                           - A biliary sphincterotomy was performed.                           - The biliary tree was swept and nothing was found.                           - One plastic biliary stent was placed into the                            common bile duct. Moderate Sedation:      Not Applicable - Patient had care per Anesthesia. Recommendation:           - Clear liquid diet for 6 hours. Procedure Code(s):        --- Professional ---                           [phone], Endoscopic retrograde                            cholangiopancreatography (ERCP); with placement of                            endoscopic stent into biliary or pancreatic duct,                            including pre- and post-dilation and guide wire                            passage, when performed, including sphincterotomy,                            when performed, each stent Diagnosis Code(s):        --- Professional ---                           K83.8, Other specified diseases of biliary tract CPT copyright 2019 [employer]. All rights  reserved. The codes documented in this report are preliminary and upon coder review may  be revised to meet current compliance requirements. Clarene Essex, MD 04/29/2022 12:15:24 PM This report has been signed electronically. Number of Addenda: 0

## 2022-04-29 NOTE — Progress Notes (Signed)
Arthur Shaw 11:22 AM  Subjective: Patient seen and examined and case again discussed with his wife and he was seen recently in the office and no new changes other than his drainage seems to be decreasing somewhat and answered all of the wife's questions and case discussed with Dr. Zenia Resides his surgeon  Objective: Vital signs stable afebrile no acute distress exam please see preassessment evaluation labs and x-rays reviewed  Assessment: Bile leak possibly improving lately wife still wants me to proceed  Plan: Okay to proceed with ERCP with anesthesia assistance and possibly stenting but we discussed with drainage decreasing on its own being less aggressive than we might if it continued at the high rate in an effort to decrease the risk and wife agrees with the plan  [facility]  office [phone] After 5PM or if no answer call [phone]

## 2022-04-29 NOTE — Anesthesia Preprocedure Evaluation (Signed)
Anesthesia Evaluation  Patient identified by MRN, date of birth, ID band Patient awake    Reviewed: Allergy & Precautions, NPO status , Patient's Chart, lab work & pertinent test results  History of Anesthesia Complications Negative for: history of anesthetic complications  Airway Mallampati: III  TM Distance: >3 FB Neck ROM: Full    Dental  (+) Dental Advisory Given   Pulmonary    breath sounds clear to auscultation       Cardiovascular hypertension, Pt. on medications and Pt. on home beta blockers + CAD   Rhythm:Regular     Neuro/Psych PSYCHIATRIC DISORDERS Anxiety  Neuromuscular disease    GI/Hepatic GERD  ,Lab Results      Component                Value               Date                      ALT                      13                  04/05/2022                AST                      18                  04/05/2022                ALKPHOS                  57                  04/05/2022                BILITOT                  1.5 (H)             04/05/2022            Acute cholecystitis   Endo/Other  negative endocrine ROS  Renal/GU Lab Results      Component                Value               Date                      CREATININE               0.96                04/05/2022                Musculoskeletal  (+) Fibromyalgia -  Abdominal   Peds  Hematology Lab Results      Component                Value               Date                      WBC                      17.5 (H)  04/05/2022                HGB                      13.6                04/05/2022                HCT                      39.7                04/05/2022                MCV                      85.6                04/05/2022                PLT                      216                 04/05/2022              Anesthesia Other Findings   Reproductive/Obstetrics                             Anesthesia  Physical  Anesthesia Plan  ASA: 2  Anesthesia Plan: General   Post-op Pain Management: Toradol IV (intra-op)* and Ofirmev IV (intra-op)*   Induction: Intravenous  PONV Risk Score and Plan: 2 and Ondansetron, Midazolam and Treatment may vary due to age or medical condition  Airway Management Planned: Oral ETT  Additional Equipment: None  Intra-op Plan:   Post-operative Plan: Extubation in OR  Informed Consent: I have reviewed the patients History and Physical, chart, labs and discussed the procedure including the risks, benefits and alternatives for the proposed anesthesia with the patient or authorized representative who has indicated his/her understanding and acceptance.     Dental advisory given  Plan Discussed with: CRNA  Anesthesia Plan Comments:         Anesthesia Quick Evaluation

## 2022-04-29 NOTE — Anesthesia Procedure Notes (Signed)
Procedure Name: Intubation Date/Time: 04/29/2022 11:34 AM  Performed by: Sharlette Dense, CRNAPatient Re-evaluated:Patient Re-evaluated prior to induction Oxygen Delivery Method: Circle system utilized Preoxygenation: Pre-oxygenation with 100% oxygen Induction Type: IV induction Ventilation: Oral airway inserted - appropriate to patient size and Mask ventilation without difficulty Laryngoscope Size: Miller and 3 Grade View: Grade I Tube type: Oral Tube size: 7.5 mm Number of attempts: 1 Airway Equipment and Method: Stylet Placement Confirmation: ETT inserted through vocal cords under direct vision, positive ETCO2 and breath sounds checked- equal and bilateral Secured at: 22 cm Tube secured with: Tape Dental Injury: Teeth and Oropharynx as per pre-operative assessment

## 2022-05-02 ENCOUNTER — Encounter (HOSPITAL_COMMUNITY): Payer: Self-pay | Admitting: Gastroenterology

## 2022-05-13 ENCOUNTER — Other Ambulatory Visit: Payer: Self-pay | Admitting: Surgery

## 2022-05-13 ENCOUNTER — Other Ambulatory Visit (HOSPITAL_COMMUNITY): Payer: Self-pay | Admitting: Surgery

## 2022-05-13 DIAGNOSIS — R1011 Right upper quadrant pain: Secondary | ICD-10-CM

## 2022-05-13 DIAGNOSIS — K838 Other specified diseases of biliary tract: Secondary | ICD-10-CM

## 2022-05-16 ENCOUNTER — Ambulatory Visit (HOSPITAL_COMMUNITY)
Admission: RE | Admit: 2022-05-16 | Discharge: 2022-05-16 | Disposition: A | Discharge status: Home / Self Care | Payer: PPO | Source: Ambulatory Visit | Attending: Surgery | Admitting: Surgery

## 2022-05-16 DIAGNOSIS — N200 Calculus of kidney: Secondary | ICD-10-CM | POA: Diagnosis not present

## 2022-05-16 DIAGNOSIS — K838 Other specified diseases of biliary tract: Secondary | ICD-10-CM | POA: Diagnosis not present

## 2022-05-16 DIAGNOSIS — K760 Fatty (change of) liver, not elsewhere classified: Secondary | ICD-10-CM | POA: Diagnosis not present

## 2022-05-16 DIAGNOSIS — K9189 Other postprocedural complications and disorders of digestive system: Secondary | ICD-10-CM | POA: Diagnosis not present

## 2022-05-16 DIAGNOSIS — R1011 Right upper quadrant pain: Secondary | ICD-10-CM | POA: Diagnosis not present

## 2022-05-16 MED ORDER — IOHEXOL 300 MG/ML  SOLN
100.0000 mL | Freq: Once | INTRAMUSCULAR | Status: AC | PRN
Start: 2022-05-16 — End: 2022-05-16
  Administered 2022-05-16: 100 mL via INTRAVENOUS

## 2022-05-16 MED ORDER — SODIUM CHLORIDE (PF) 0.9 % IJ SOLN
INTRAMUSCULAR | Status: AC
  Filled 2022-05-16: qty 50

## 2022-05-26 ENCOUNTER — Other Ambulatory Visit: Payer: Self-pay | Admitting: Gastroenterology

## 2022-06-05 ENCOUNTER — Inpatient Hospital Stay (HOSPITAL_BASED_OUTPATIENT_CLINIC_OR_DEPARTMENT_OTHER)
Admission: EM | Admit: 2022-06-05 | Discharge: 2022-06-09 | DRG: 863 | Disposition: A | Discharge status: Home / Self Care | Payer: PPO | Attending: General Surgery | Admitting: General Surgery

## 2022-06-05 ENCOUNTER — Other Ambulatory Visit: Payer: Self-pay

## 2022-06-05 ENCOUNTER — Emergency Department (HOSPITAL_BASED_OUTPATIENT_CLINIC_OR_DEPARTMENT_OTHER): Payer: PPO

## 2022-06-05 ENCOUNTER — Encounter (HOSPITAL_BASED_OUTPATIENT_CLINIC_OR_DEPARTMENT_OTHER): Payer: Self-pay | Admitting: Emergency Medicine

## 2022-06-05 DIAGNOSIS — Z7902 Long term (current) use of antithrombotics/antiplatelets: Secondary | ICD-10-CM

## 2022-06-05 DIAGNOSIS — R1310 Dysphagia, unspecified: Secondary | ICD-10-CM | POA: Diagnosis not present

## 2022-06-05 DIAGNOSIS — L02211 Cutaneous abscess of abdominal wall: Secondary | ICD-10-CM | POA: Diagnosis not present

## 2022-06-05 DIAGNOSIS — Z9884 Bariatric surgery status: Secondary | ICD-10-CM | POA: Diagnosis not present

## 2022-06-05 DIAGNOSIS — Z8249 Family history of ischemic heart disease and other diseases of the circulatory system: Secondary | ICD-10-CM | POA: Diagnosis not present

## 2022-06-05 DIAGNOSIS — I2510 Atherosclerotic heart disease of native coronary artery without angina pectoris: Secondary | ICD-10-CM | POA: Diagnosis present

## 2022-06-05 DIAGNOSIS — E876 Hypokalemia: Secondary | ICD-10-CM | POA: Diagnosis present

## 2022-06-05 DIAGNOSIS — N401 Enlarged prostate with lower urinary tract symptoms: Secondary | ICD-10-CM | POA: Diagnosis present

## 2022-06-05 DIAGNOSIS — L03311 Cellulitis of abdominal wall: Secondary | ICD-10-CM | POA: Diagnosis present

## 2022-06-05 DIAGNOSIS — M797 Fibromyalgia: Secondary | ICD-10-CM | POA: Diagnosis not present

## 2022-06-05 DIAGNOSIS — R972 Elevated prostate specific antigen [PSA]: Secondary | ICD-10-CM | POA: Diagnosis not present

## 2022-06-05 DIAGNOSIS — M109 Gout, unspecified: Secondary | ICD-10-CM | POA: Diagnosis present

## 2022-06-05 DIAGNOSIS — N281 Cyst of kidney, acquired: Secondary | ICD-10-CM | POA: Diagnosis not present

## 2022-06-05 DIAGNOSIS — Z79899 Other long term (current) drug therapy: Secondary | ICD-10-CM | POA: Diagnosis not present

## 2022-06-05 DIAGNOSIS — Z955 Presence of coronary angioplasty implant and graft: Secondary | ICD-10-CM

## 2022-06-05 DIAGNOSIS — N400 Enlarged prostate without lower urinary tract symptoms: Secondary | ICD-10-CM | POA: Diagnosis not present

## 2022-06-05 DIAGNOSIS — Z87891 Personal history of nicotine dependence: Secondary | ICD-10-CM | POA: Diagnosis not present

## 2022-06-05 DIAGNOSIS — R338 Other retention of urine: Secondary | ICD-10-CM | POA: Diagnosis not present

## 2022-06-05 DIAGNOSIS — I10 Essential (primary) hypertension: Secondary | ICD-10-CM | POA: Diagnosis not present

## 2022-06-05 DIAGNOSIS — K219 Gastro-esophageal reflux disease without esophagitis: Secondary | ICD-10-CM | POA: Diagnosis present

## 2022-06-05 DIAGNOSIS — Y838 Other surgical procedures as the cause of abnormal reaction of the patient, or of later complication, without mention of misadventure at the time of the procedure: Secondary | ICD-10-CM | POA: Diagnosis present

## 2022-06-05 DIAGNOSIS — E871 Hypo-osmolality and hyponatremia: Secondary | ICD-10-CM | POA: Diagnosis not present

## 2022-06-05 DIAGNOSIS — R339 Retention of urine, unspecified: Secondary | ICD-10-CM | POA: Diagnosis not present

## 2022-06-05 DIAGNOSIS — T8140XA Infection following a procedure, unspecified, initial encounter: Principal | ICD-10-CM | POA: Diagnosis present

## 2022-06-05 DIAGNOSIS — Z885 Allergy status to narcotic agent status: Secondary | ICD-10-CM

## 2022-06-05 DIAGNOSIS — E785 Hyperlipidemia, unspecified: Secondary | ICD-10-CM | POA: Diagnosis not present

## 2022-06-05 DIAGNOSIS — E669 Obesity, unspecified: Secondary | ICD-10-CM | POA: Diagnosis not present

## 2022-06-05 DIAGNOSIS — Z9049 Acquired absence of other specified parts of digestive tract: Secondary | ICD-10-CM

## 2022-06-05 DIAGNOSIS — T8149XA Infection following a procedure, other surgical site, initial encounter: Secondary | ICD-10-CM | POA: Diagnosis not present

## 2022-06-05 DIAGNOSIS — Z6825 Body mass index (BMI) 25.0-25.9, adult: Secondary | ICD-10-CM

## 2022-06-05 DIAGNOSIS — F0390 Unspecified dementia without behavioral disturbance: Secondary | ICD-10-CM | POA: Diagnosis not present

## 2022-06-05 DIAGNOSIS — Z888 Allergy status to other drugs, medicaments and biological substances status: Secondary | ICD-10-CM

## 2022-06-05 HISTORY — DX: Acquired absence of stomach (part of): Z90.3

## 2022-06-05 HISTORY — DX: Presence of coronary angioplasty implant and graft: Z95.5

## 2022-06-05 LAB — CBC WITH DIFFERENTIAL/PLATELET
Abs Immature Granulocytes: 0.03 10*3/uL (ref 0.00–0.07)
Basophils Absolute: 0 10*3/uL (ref 0.0–0.1)
Basophils Relative: 0 %
Eosinophils Absolute: 0 10*3/uL (ref 0.0–0.5)
Eosinophils Relative: 0 %
HCT: 35.4 % — ABNORMAL LOW (ref 39.0–52.0)
Hemoglobin: 11.9 g/dL — ABNORMAL LOW (ref 13.0–17.0)
Immature Granulocytes: 0 %
Lymphocytes Relative: 9 %
Lymphs Abs: 1 10*3/uL (ref 0.7–4.0)
MCH: 28.3 pg (ref 26.0–34.0)
MCHC: 33.6 g/dL (ref 30.0–36.0)
MCV: 84.1 fL (ref 80.0–100.0)
Monocytes Absolute: 0.9 10*3/uL (ref 0.1–1.0)
Monocytes Relative: 8 %
Neutro Abs: 9.3 10*3/uL — ABNORMAL HIGH (ref 1.7–7.7)
Neutrophils Relative %: 83 %
Platelets: 285 10*3/uL (ref 150–400)
RBC: 4.21 MIL/uL — ABNORMAL LOW (ref 4.22–5.81)
RDW: 17.1 % — ABNORMAL HIGH (ref 11.5–15.5)
WBC: 11.2 10*3/uL — ABNORMAL HIGH (ref 4.0–10.5)
nRBC: 0 % (ref 0.0–0.2)

## 2022-06-05 LAB — COMPREHENSIVE METABOLIC PANEL
ALT: 14 U/L (ref 0–44)
AST: 15 U/L (ref 15–41)
Albumin: 2.9 g/dL — ABNORMAL LOW (ref 3.5–5.0)
Alkaline Phosphatase: 70 U/L (ref 38–126)
Anion gap: 10 (ref 5–15)
BUN: 20 mg/dL (ref 8–23)
CO2: 25 mmol/L (ref 22–32)
Calcium: 8.1 mg/dL — ABNORMAL LOW (ref 8.9–10.3)
Chloride: 98 mmol/L (ref 98–111)
Creatinine, Ser: 0.89 mg/dL (ref 0.61–1.24)
GFR, Estimated: >60 mL/min (ref >60)
Glucose, Bld: 142 mg/dL — ABNORMAL HIGH (ref 70–99)
Potassium: 2.7 mmol/L — CL (ref 3.5–5.1)
Sodium: 133 mmol/L — ABNORMAL LOW (ref 135–145)
Total Bilirubin: 1.4 mg/dL — ABNORMAL HIGH (ref 0.3–1.2)
Total Protein: 6.1 g/dL — ABNORMAL LOW (ref 6.5–8.1)

## 2022-06-05 MED ORDER — POTASSIUM CHLORIDE 10 MEQ/100ML IV SOLN
INTRAVENOUS | Status: AC
  Filled 2022-06-05: qty 100

## 2022-06-05 MED ORDER — SODIUM CHLORIDE 0.9 % IV SOLN: Freq: Once | INTRAVENOUS | Status: AC

## 2022-06-05 MED ORDER — POTASSIUM CHLORIDE 10 MEQ/100ML IV SOLN
10.0000 meq | Freq: Once | INTRAVENOUS | Status: AC
Start: 2022-06-05 — End: 2022-06-05
  Administered 2022-06-05: 10 meq via INTRAVENOUS
  Filled 2022-06-05: qty 100

## 2022-06-05 MED ORDER — IOHEXOL 300 MG/ML  SOLN
100.0000 mL | Freq: Once | INTRAMUSCULAR | Status: AC | PRN
  Administered 2022-06-05: 100 mL via INTRAVENOUS

## 2022-06-05 MED ORDER — POTASSIUM CHLORIDE 10 MEQ/100ML IV SOLN
10.0000 meq | INTRAVENOUS | Status: AC
  Administered 2022-06-05 – 2022-06-06 (×2): 10 meq via INTRAVENOUS
  Filled 2022-06-05 (×2): qty 100

## 2022-06-05 NOTE — ED Triage Notes (Signed)
Had surgery July 4th had jp drain placed jp drain was removed  8/23 and patient states that he thinks the site is infection. States that it is red at the site and that it is swollen.States that he is also having pain at the site

## 2022-06-05 NOTE — ED Provider Notes (Signed)
Patient sent here from [place] to be seen by Dr. Marlou Starks and general surgery.  Discussed with Dr. Marlou Starks and he will see the patient here   Lacretia Leigh, MD 06/05/22 2223

## 2022-06-05 NOTE — ED Notes (Signed)
PA notified of K 2.7.

## 2022-06-05 NOTE — ED Provider Notes (Signed)
Patient care taken over from Dorise Bullion, PA-C at shift handoff  Patient is a 76 year old male with history of hypertension, hyperlipidemia, GERD, cardiac stent placement, gastric sleeve procedure, fibromyalgia, and CAD.  Presented to the hospital with a postoperative concern.  On July 4 he underwent a cholecystectomy with subsequent ERCP due to continuous bile leak.  Patient also had a JP drain placed which was removed on August 23.  Over the last week patient has noticed increased redness, warmth, and significant pain in the area of the JP drain.  The area closed most immediately after the JP drain was removed.  Patient has denied fevers, chills, nausea vomiting, changes in bowel movements, urinary symptoms, or active drainage from the wound site.  While here in the emergency department the patient began to experience purulent drainage from the site Physical Exam  BP 133/75   Pulse 95   Temp 98 F (36.7 C)   Resp 14   Wt 79.9 kg   SpO2 96%   BMI 25.27 kg/m   Physical Exam Vitals and nursing note reviewed.  Constitutional:      General: He is not in acute distress. Cardiovascular:     Rate and Rhythm: Normal rate.  Pulmonary:     Effort: Pulmonary effort is normal.  Skin:    Comments: Purulent drainage from previous JP drain site  Neurological:     Mental Status: He is alert.     Procedures  Procedures  ED Course / MDM    Medical Decision Making Amount and/or Complexity of Data Reviewed Labs: ordered. Radiology: ordered.  Risk Prescription drug management.   I reviewed and interpreted the patient's CT abdomen pelvis. 1. Interval removal of a surgical drain with slight interval increase in size of a 3 x 2 x 3 cm gallbladder fossa abscess (from 2 x 1 x 1.5 cm). 2. Patent common bile duct stent. No associated biliary ductal dilatation. Proximal tip terminating within the mid common bile duct and distal tip terminating within the third portion of the duodenum. 3.  Removed surgical drain track noted along the right lateral abdominal wall with query developing sub cutaneous and intramuscular abscess formation measuring 6.5 x 1.9 cm. Recommend attention on follow-up. 4. A stable fat density 3.8 cm left adrenal gland nodule likely represents a myelolipoma. 5. Nonobstructive punctate left nephrolithiasis. 6. Prostatomegaly. 7. Aortic Atherosclerosis  I agree with the radiologist findings  I requested consultation with general surgery.  Dr. Marlou Starks suggested transfer to [facility] emergency department where he plans to evaluate the patient personally.  Dr. Philip Aspen excepted the patient at [facility] long.  [facility] to transfer patient.  Patient's labs were significant for an initial potassium of 2.7, WBC 11.2.  10 mEq of potassium chloride administered prior to [facility] transfer.      Ronny Bacon 06/05/22 2122    Malvin Johns, MD 06/05/22 2223

## 2022-06-05 NOTE — ED Notes (Signed)
Surgeon at bedside.  

## 2022-06-05 NOTE — ED Provider Notes (Signed)
[facility] Provider Note   CSN: [id number] Arrival date & time: 06/05/22  1619     History  Chief Complaint  Patient presents with   Post-op Problem    Arthur Shaw is a 76 y.o. male with a history of essential HTN, hyperlipidemia, GERD, prior cardiac stent placement, prior gastric sleeve procedure, fibromyalgia, and CAD.  Presenting today with postoperative concern.  On 04/05/2022 underwent cholecystectomy, anesthetic complications on and off since then.  Also underwent recent ERCP due to continuous bile leak.  Per patient's spouse, gallbladder was necrotic at the time of removal, was unable to place a biliary stent and had to attempt suturing nearby tissue, which contributed to the biliary leak.  Has had a JP drain in place up until 05/25/2022 which it was removed in clinic.  Over the last week, patient has noticed increasing redness, warmth, and significant pain of the area.  Also accompanied with occasional sharp pains near the recent JP drain site that leave him breathless.  Denies fever, chills, N/V, recent changes in BM, bloody stools, urinary symptoms, or active drainage from the wound site.  On Plavix twice daily.  The history is provided by the patient and medical records.     Home Medications Prior to Admission medications   Medication Sig Start Date End Date Taking? Authorizing Provider  Acetaminophen (TYLENOL EXTRA STRENGTH PO) Take 500 mg by mouth every 8 (eight) hours as needed (pain). Liquid Form    [provider]  acetaminophen-codeine (TYLENOL #3) 300-30 MG tablet Take 1 tablet by mouth every 8 (eight) hours as needed for moderate pain. 04/07/22   Dwyane Dee, MD  amLODipine (NORVASC) 5 MG tablet Take 5 mg by mouth at bedtime. 03/31/22   [provider]  benazepril (LOTENSIN) 40 MG tablet Take 40 mg by mouth daily.    [provider]  cetirizine (ZYRTEC) 10 MG tablet Take 10 mg by mouth daily as needed for  allergies.    [provider]  Cholecalciferol (VITAMIN D3) 125 MCG (5000 UT) CAPS Take 5,000 Units by mouth daily.    [provider]  clopidogrel (PLAVIX) 75 MG tablet Take 75 mg by mouth daily.    [provider]  cyclobenzaprine (FLEXERIL) 10 MG tablet Take 10 mg by mouth 3 (three) times daily as needed for muscle spasms.    [provider]  docusate sodium (STOOL SOFTENER) 100 MG capsule Take 100 mg by mouth daily as needed for mild constipation.    [provider]  eszopiclone (LUNESTA) 2 MG TABS tablet Take 2 mg by mouth at bedtime.    [provider]  ezetimibe (ZETIA) 10 MG tablet Take 10 mg by mouth at bedtime.    [provider]  fluticasone (FLONASE) 50 MCG/ACT nasal spray Place 1 spray into both nostrils daily as needed for allergies or rhinitis.    [provider]  guaiFENesin-codeine (ROBITUSSIN AC) 100-10 MG/5ML syrup Take 4 mLs by mouth every 8 (eight) hours as needed for cough.    [provider]  lansoprazole (PREVACID) 30 MG capsule Take 30 mg by mouth every evening. 03/11/22   [provider]  MELATONIN PO Take 10 mg by mouth at bedtime.    [provider]  metoprolol (LOPRESSOR) 50 MG tablet Take 75 mg by mouth 2 (two) times daily.    [provider]  montelukast (SINGULAIR) 10 MG tablet Take 10 mg by mouth at bedtime.    [provider]  rosuvastatin (CRESTOR) 10 MG tablet Take 1 tablet (10 mg total) by mouth daily. Patient taking differently: Take 10 mg by mouth once a week. 02/19/16   Jettie Booze, MD  triamcinolone lotion (KENALOG) 0.1 % Apply 1 Application topically daily as needed for rash. 03/09/22   [provider]      Allergies    Morphine, Statins, and Welchol [colesevelam hcl]    Review of Systems   Review of Systems  Constitutional:        Postoperative problem  Skin:  Positive for wound.    Physical Exam Updated Vital  Signs BP 129/66   Pulse 86   Temp 98.1 F (36.7 C)   Resp 18   Wt 79.9 kg   SpO2 98%   BMI 25.27 kg/m  Physical Exam Vitals and nursing note reviewed.  Constitutional:      General: He is not in acute distress.    Appearance: He is well-developed. He is not ill-appearing, toxic-appearing or diaphoretic.  HENT:     Head: Normocephalic and atraumatic.  Eyes:     Conjunctiva/sclera: Conjunctivae normal.  Cardiovascular:     Rate and Rhythm: Normal rate and regular rhythm.     Heart sounds: No murmur heard. Pulmonary:     Effort: Pulmonary effort is normal. No respiratory distress.     Breath sounds: Normal breath sounds.  Chest:     Chest wall: No tenderness.  Abdominal:     Palpations: Abdomen is soft.     Tenderness: There is no abdominal tenderness.  Musculoskeletal:        General: No swelling.     Cervical back: Neck supple.  Skin:    General: Skin is warm and dry.     Capillary Refill: Capillary refill takes less than 2 seconds.     Comments: Wound of the right mid abdomen, near the mid axillary line and is the location of the prior JP drain, with swelling, erythema, and significant tenderness.  See photos.  Neurological:     Mental Status: He is alert and oriented to person, place, and time.  Psychiatric:        Mood and Affect: Mood normal.        ED Results / Procedures / Treatments   Labs (all labs ordered are listed, but only abnormal results are displayed) Labs Reviewed  CBC WITH DIFFERENTIAL/PLATELET - Abnormal; Notable for the following components:      Result Value   WBC 11.2 (*)    RBC 4.21 (*)    Hemoglobin 11.9 (*)    HCT 35.4 (*)    RDW 17.1 (*)    Neutro Abs 9.3 (*)    All other components within normal limits  COMPREHENSIVE METABOLIC PANEL - Abnormal; Notable for the following components:   Sodium 133 (*)    Potassium 2.7 (*)    Glucose, Bld 142 (*)    Calcium 8.1 (*)    Total Protein 6.1 (*)    Albumin 2.9 (*)    Total Bilirubin  1.4 (*)    All other components within normal limits    EKG None  Radiology No results found.  Procedures Procedures    Medications Ordered in ED Medications  potassium chloride 10 mEq in 100 mL IVPB (has no administration in time range)  iohexol (OMNIPAQUE) 300 MG/ML solution 100 mL (has no administration in time range)  0.9 %  sodium chloride infusion (has no administration in time range)  ED Course/ Medical Decision Making/ A&P                           Medical Decision Making Amount and/or Complexity of Data Reviewed Labs: ordered. Radiology: ordered.  Risk Prescription drug management.   76 y.o. male presents to the ED for concern of Post-op Problem     This involves an extensive number of treatment options, and is a complaint that carries with it a high risk of complications and morbidity.     Past Medical History / Co-morbidities / Social History: Hx of essential HTN, hyperlipidemia, GERD, prior cardiac stent placement, prior gastric sleeve procedure, fibromyalgia, and CAD Social Determinants of Health include: Elderly  Additional History:  Obtained by chart review.  Notably: 04/05/22: laparoscopic cholecystectomy, with noted complications of biliary leak 04/29/22: ERCP with biliary stent placement 05/25/22: JP drain removal  Lab Tests: I ordered, and personally interpreted labs.  The pertinent results include:   Critical potassium 2.7, IV potassium ordered Mild elevated WBC 11.2 with leukocytosis H&H mildly decreased, appears somewhat stable from recent values Elevated total bili of 1.4 Mild hyponatremia of 133  Imaging Studies: CT abdomen and pelvis pending  Cardiac Monitoring: The patient was maintained on a cardiac monitor.  I personally viewed and interpreted the cardiac monitored which showed an underlying rhythm of: NSR  ED Course / Critical Interventions: Pt well-appearing on exam.  Nontoxic, nonseptic appearing in NAD.  Presenting with  concern from recent JP drain removal.  Now with surrounding erythema, tenderness, and warmth.  Incision site appears raised to a point, with significant fluctuance behind it, likely representative of purulent discharge.  No active drainage at this time.  Afebrile, hemodynamically stable..  Concern for possible postoperative abscess or other infectious complication.  CT abdomen and pelvis ordered for further evaluation. Notified by nursing staff of critical potassium.  EKG and IV potassium ordered.  Disposition: 1915 care of Arthur Shaw transferred to [facility] at the end of my shift as the patient will require reassessment once labs/imaging have resulted.  Patient presentation, ED course, and plan of care discussed with review of all pertinent labs and imaging.  Please see his/her note for further details regarding further ED course and disposition.   Plan at time of handoff is dependent on CT imaging, plan to consult with general surgery for further recommendations based on results.  Also plan to continue with potassium replenishment and cardiac monitoring.  Anticipate This may be altered or completely changed at the discretion of the oncoming team pending results of further workup.    I discussed this case with my attending, Dr. Tamera Punt, who agreed with the proposed treatment course and cosigned this note including patient's presenting symptoms, physical exam, and planned diagnostics and interventions.  Attending physician stated agreement with plan or made changes to plan which were implemented.     This chart was dictated using voice recognition software.  Despite best efforts to proofread, errors can occur which can change the documentation meaning.         Final Clinical Impression(s) / ED Diagnoses Final diagnoses:  Postoperative infection, unspecified type, initial encounter    Rx / DC Orders ED Discharge Orders     None         Candace Cruise 99/37/16  1932    Malvin Johns, MD 06/05/22 2224

## 2022-06-05 NOTE — H&P (Signed)
Arthur Shaw is an 76 y.o. male.   Chief Complaint: Abdominal pain HPI: The patient is a 76 year old white male who is about 2 months status post laparoscopic cholecystectomy.  His postoperative course was complicated by a bile leak requiring a percutaneous drain.  The drain was in for quite some time but was removed about 2 weeks ago.  Over the last couple days he has had increased swelling and pain along the right flank where the drain came out.  He denies any fevers or chills.  He denies any nausea or vomiting.  He came to the emergency department where a CT scan showed a developing phlegmon in the right lateral abdominal wall and a small 2 x 3 cm fluid collection in the gallbladder fossa.  Past Medical History:  Diagnosis Date   Adenomatous colon polyp    4/06 FS--[facility]; 9/07 colon: [facility] sm aden p   ASCVD (arteriosclerotic cardiovascular disease)    single vessel, s/p DE stent to mid LAD 4/06   Essential hypertension, benign    Fibromyalgia    questionable   Former smoker, stopped smoking in distant past    GERD (gastroesophageal reflux disease)    UGI 09/2012   Gout    H/O heart artery stent    Hyperlipidemia    Macular hole    at [facility]   Obesity    Prediabetes    S/P gastric sleeve procedure    Statin intolerance     Past Surgical History:  Procedure Laterality Date   BILIARY STENT PLACEMENT N/A 04/29/2022   Procedure: BILIARY STENT PLACEMENT;  Surgeon: Clarene Essex, MD;  Location: [facility];  Service: Gastroenterology;  Laterality: N/A;   CHOLECYSTECTOMY N/A 04/05/2022   Procedure: LAPAROSCOPIC CHOLECYSTECTOMY;  Surgeon: Dwan Bolt, MD;  Location: [facility];  Service: General;  Laterality: N/A;   CORONARY STENT PLACEMENT     taxus stent placed-Dr. Ilda Foil LAS 01/2005   ENDOSCOPIC RETROGRADE CHOLANGIOPANCREATOGRAPHY (ERCP) WITH PROPOFOL N/A 04/29/2022   Procedure: ENDOSCOPIC RETROGRADE CHOLANGIOPANCREATOGRAPHY (ERCP) WITH PROPOFOL;  Surgeon: Clarene Essex, MD;   Location: [facility];  Service: Gastroenterology;  Laterality: N/A;   REMOVAL OF STONES  04/29/2022   Procedure: Balloon Sweep of CBD;  Surgeon: Clarene Essex, MD;  Location: [facility];  Service: Gastroenterology;;   Joan Mayans  04/29/2022   Procedure: Joan Mayans;  Surgeon: Clarene Essex, MD;  Location: [facility];  Service: Gastroenterology;;   STOMACH SURGERY     bariatric surgery 05/19/13   TONSILLECTOMY      Family History  Problem Relation Age of Onset   Dementia Mother    Arrhythmia Father        afib   Heart failure Father    Fibromyalgia Sister    Hyperlipidemia Brother    CAD Brother    Drug abuse Son    Social History:  reports that he has never smoked. He has never used smokeless tobacco. He reports current alcohol use. He reports that he does not use drugs.  Allergies:  Allergies  Allergen Reactions   Morphine     Pt was not himself, wife states he was "Bonkers"   Statins     memory fatigue   Welchol [Colesevelam Hcl]     Muscle aches    (Not in a hospital admission)   Results for orders placed or performed during the hospital encounter of 06/05/22 (from the past 48 hour(s))  CBC with Differential     Status: Abnormal   Collection Time: 06/05/22  6:19 PM  Result Value Ref Range   WBC 11.2 (H) 4.0 - 10.5 K/uL   RBC 4.21 (L) 4.22 - 5.81 MIL/uL   Hemoglobin 11.9 (L) 13.0 - 17.0 g/dL   HCT 35.4 (L) 39.0 - 52.0 %   MCV 84.1 80.0 - 100.0 fL   MCH 28.3 26.0 - 34.0 pg   MCHC 33.6 30.0 - 36.0 g/dL   RDW 17.1 (H) 11.5 - 15.5 %   Platelets 285 150 - 400 K/uL   nRBC 0.0 0.0 - 0.2 %   Neutrophils Relative % 83 %   Neutro Abs 9.3 (H) 1.7 - 7.7 K/uL   Lymphocytes Relative 9 %   Lymphs Abs 1.0 0.7 - 4.0 K/uL   Monocytes Relative 8 %   Monocytes Absolute 0.9 0.1 - 1.0 K/uL   Eosinophils Relative 0 %   Eosinophils Absolute 0.0 0.0 - 0.5 K/uL   Basophils Relative 0 %   Basophils Absolute 0.0 0.0 - 0.1 K/uL   Immature Granulocytes 0 %   Abs Immature  Granulocytes 0.03 0.00 - 0.07 K/uL    Comment: Performed at [facility], [address] [place], [place] [address]  Comprehensive metabolic panel     Status: Abnormal   Collection Time: 06/05/22  6:19 PM  Result Value Ref Range   Sodium 133 (L) 135 - 145 mmol/L   Potassium 2.7 (LL) 3.5 - 5.1 mmol/L    Comment: CRITICAL RESULT CALLED TO, READ BACK BY AND VERIFIED WITH POWELL,V RN '@1906'$  06/05/22 EDENSCA   Chloride 98 98 - 111 mmol/L   CO2 25 22 - 32 mmol/L   Glucose, Bld 142 (H) 70 - 99 mg/dL    Comment: Glucose reference range applies only to samples taken after fasting for at least 8 hours.   BUN 20 8 - 23 mg/dL   Creatinine, Ser 0.89 0.61 - 1.24 mg/dL   Calcium 8.1 (L) 8.9 - 10.3 mg/dL   Total Protein 6.1 (L) 6.5 - 8.1 g/dL   Albumin 2.9 (L) 3.5 - 5.0 g/dL   AST 15 15 - 41 U/L   ALT 14 0 - 44 U/L   Alkaline Phosphatase 70 38 - 126 U/L   Total Bilirubin 1.4 (H) 0.3 - 1.2 mg/dL   GFR, Estimated >60 >60 mL/min    Comment: (NOTE) Calculated using the CKD-EPI Creatinine Equation (2021)    Anion gap 10 5 - 15    Comment: Performed at [facility], [address] [place], [place] [address]   CT Abdomen Pelvis W Contrast  Result Date: 06/05/2022 CLINICAL DATA:  Abdominal pain, post-op EXAM: CT ABDOMEN AND PELVIS WITH CONTRAST TECHNIQUE: Multidetector CT imaging of the abdomen and pelvis was performed using the standard protocol following bolus administration of intravenous contrast. RADIATION DOSE REDUCTION: This exam was performed according to the departmental dose-optimization program which includes automated exposure control, adjustment of the mA and/or kV according to patient size and/or use of iterative reconstruction technique. CONTRAST:  142m OMNIPAQUE IOHEXOL 300 MG/ML  SOLN COMPARISON:  CT abdomen pelvis 05/16/2022, CT abdomen pelvis 04/04/2022 FINDINGS: Lower chest: No acute abnormality. Hepatobiliary: Subcentimeter hypodensity too small to characterize. No  focal liver abnormality. Status post cholecystectomy. Interval removal of a surgical drain with slight interval increase in size of a gallbladder fossa 3 x 2 x 3 cm fluid collection with peripheral enhancement (from 2 x 1 x 1.5 cm). Common bile duct stent is again noted with proximal tip terminating within the mid common  bile duct and distal tip terminating within the third portion of the duodenum. Associated pneumobilia is noted. No biliary dilatation. Pancreas: No focal lesion. Normal pancreatic contour. No surrounding inflammatory changes. No main pancreatic ductal dilatation. Spleen: Normal in size without focal abnormality. Adrenals/Urinary Tract: There is a stable 3.3 x 2.8 left adrenal gland nodule with a density of -50 Hounsfield units. Bilateral kidneys enhance symmetrically. Fluid dense lesion within left kidney likely represents a simple renal cyst. Simple renal cysts, in the absence of clinically indicated signs/symptoms, require no independent follow-up. Punctate left nephrolithiasis. No right nephrolithiasis. No ureterolithiasis bilaterally. No hydronephrosis. No hydroureter. The urinary bladder is unremarkable. On delayed imaging, there is no urothelial wall thickening and there are no filling defects in the opacified portions of the bilateral collecting systems or ureters. Stomach/Bowel: Stomach is within normal limits. No evidence of bowel wall thickening or dilatation. Appendix appears normal. Vascular/Lymphatic: No abdominal aorta or iliac aneurysm. Mild atherosclerotic plaque of the aorta and its branches. No abdominal, pelvic, or inguinal lymphadenopathy. Reproductive: The prostate is enlarged measuring up to 4.9 cm. Other: No intraperitoneal free fluid. No intraperitoneal free gas. Surgical drain tract noted along the right lateral abdominal wall with query developing sub cutaneous and intramuscular abscess formation measuring 6.5 x 1.9 cm). Musculoskeletal: Right lateral wall subcutaneus soft  tissue edema. No suspicious lytic or blastic osseous lesions. No acute displaced fracture. Multilevel degenerative changes of the spine. IMPRESSION: 1. Interval removal of a surgical drain with slight interval increase in size of a 3 x 2 x 3 cm gallbladder fossa abscess (from 2 x 1 x 1.5 cm). 2. Patent common bile duct stent. No associated biliary ductal dilatation. Proximal tip terminating within the mid common bile duct and distal tip terminating within the third portion of the duodenum. 3. Removed surgical drain track noted along the right lateral abdominal wall with query developing sub cutaneous and intramuscular abscess formation measuring 6.5 x 1.9 cm. Recommend attention on follow-up. 4. A stable fat density 3.8 cm left adrenal gland nodule likely represents a myelolipoma. 5. Nonobstructive punctate left nephrolithiasis. 6. Prostatomegaly. 7.  Aortic Atherosclerosis (ICD10-I70.0). Electronically Signed   By: Iven Finn M.D.   On: 06/05/2022 20:23    Review of Systems  Constitutional: Negative.   HENT: Negative.    Eyes: Negative.   Respiratory: Negative.    Cardiovascular: Negative.   Gastrointestinal:  Positive for abdominal pain.  Endocrine: Negative.   Genitourinary: Negative.   Musculoskeletal: Negative.   Skin:  Positive for wound.  Allergic/Immunologic: Negative.   Neurological: Negative.   Hematological: Negative.   Psychiatric/Behavioral: Negative.      Blood pressure 120/74, pulse 89, temperature 98 F (36.7 C), resp. rate 15, weight 79.9 kg, SpO2 98 %. Physical Exam Vitals reviewed.  Constitutional:      General: He is not in acute distress.    Appearance: Normal appearance.  HENT:     Head: Normocephalic and atraumatic.     Right Ear: External ear normal.     Left Ear: External ear normal.     Nose: Nose normal.     Mouth/Throat:     Mouth: Mucous membranes are moist.     Pharynx: Oropharynx is clear.  Eyes:     General: No scleral icterus.    Extraocular  Movements: Extraocular movements intact.     Conjunctiva/sclera: Conjunctivae normal.     Pupils: Pupils are equal, round, and reactive to light.  Cardiovascular:     Rate and Rhythm:  Normal rate and regular rhythm.     Pulses: Normal pulses.     Heart sounds: Normal heart sounds.  Pulmonary:     Effort: Pulmonary effort is normal. No respiratory distress.     Breath sounds: Normal breath sounds.  Abdominal:     General: Abdomen is flat.     Palpations: Abdomen is soft.     Comments: There is tenderness and swelling with some cellulitis on the right flank  Musculoskeletal:        General: No swelling or deformity. Normal range of motion.     Cervical back: Normal range of motion and neck supple.  Skin:    General: Skin is warm and dry.     Coloration: Skin is not jaundiced.  Neurological:     General: No focal deficit present.     Mental Status: He is alert and oriented to person, place, and time.  Psychiatric:        Mood and Affect: Mood normal.        Behavior: Behavior normal.        Thought Content: Thought content normal.      Assessment/Plan The patient appears to have an abdominal wall abscess that is spontaneously draining with some cellulitis.  There is also a small fluid collection in the gallbladder fossa.  I do not think the small fluid collection in the gallbladder fossa is large enough to be drained percutaneously nor do I think there is a good window to get to it.  We will ask the radiologist to look at that tomorrow.  The phlegmon on the right flank is spontaneously draining and was able to be probed with a Q-tip.  We will start him on broad-spectrum antibiotic therapy and monitor this area closely.  Autumn Messing III, MD 06/05/2022, 11:22 PM

## 2022-06-06 ENCOUNTER — Encounter (HOSPITAL_COMMUNITY): Payer: Self-pay

## 2022-06-06 DIAGNOSIS — R339 Retention of urine, unspecified: Secondary | ICD-10-CM | POA: Diagnosis not present

## 2022-06-06 LAB — CBC
HCT: 39.9 % (ref 39.0–52.0)
Hemoglobin: 13.4 g/dL (ref 13.0–17.0)
MCH: 29.1 pg (ref 26.0–34.0)
MCHC: 33.6 g/dL (ref 30.0–36.0)
MCV: 86.7 fL (ref 80.0–100.0)
Platelets: 343 10*3/uL (ref 150–400)
RBC: 4.6 MIL/uL (ref 4.22–5.81)
RDW: 17.2 % — ABNORMAL HIGH (ref 11.5–15.5)
WBC: 11.6 10*3/uL — ABNORMAL HIGH (ref 4.0–10.5)
nRBC: 0 % (ref 0.0–0.2)

## 2022-06-06 LAB — SURGICAL PCR SCREEN
MRSA, PCR: NEGATIVE
Staphylococcus aureus: NEGATIVE

## 2022-06-06 LAB — COMPREHENSIVE METABOLIC PANEL
ALT: 13 U/L (ref 0–44)
AST: 15 U/L (ref 15–41)
Albumin: 3.2 g/dL — ABNORMAL LOW (ref 3.5–5.0)
Alkaline Phosphatase: 68 U/L (ref 38–126)
Anion gap: 10 (ref 5–15)
BUN: 15 mg/dL (ref 8–23)
CO2: 24 mmol/L (ref 22–32)
Calcium: 8.4 mg/dL — ABNORMAL LOW (ref 8.9–10.3)
Chloride: 101 mmol/L (ref 98–111)
Creatinine, Ser: 0.76 mg/dL (ref 0.61–1.24)
GFR, Estimated: >60 mL/min (ref >60)
Glucose, Bld: 127 mg/dL — ABNORMAL HIGH (ref 70–99)
Potassium: 2.7 mmol/L — CL (ref 3.5–5.1)
Sodium: 135 mmol/L (ref 135–145)
Total Bilirubin: 1.5 mg/dL — ABNORMAL HIGH (ref 0.3–1.2)
Total Protein: 6.7 g/dL (ref 6.5–8.1)

## 2022-06-06 LAB — MAGNESIUM: Magnesium: 1.7 mg/dL (ref 1.7–2.4)

## 2022-06-06 MED ORDER — BENAZEPRIL HCL 20 MG PO TABS
40.0000 mg | ORAL_TABLET | Freq: Every day | ORAL | Status: DC
  Administered 2022-06-06 – 2022-06-09 (×4): 40 mg via ORAL
  Filled 2022-06-06 (×4): qty 2

## 2022-06-06 MED ORDER — MUPIROCIN 2 % EX OINT: 1.0000 | TOPICAL_OINTMENT | Freq: Two times a day (BID) | CUTANEOUS | Status: DC

## 2022-06-06 MED ORDER — SODIUM CHLORIDE 0.9 % IV SOLN: INTRAVENOUS | Status: DC | PRN

## 2022-06-06 MED ORDER — ROSUVASTATIN CALCIUM 20 MG PO TABS
10.0000 mg | ORAL_TABLET | Freq: Every day | ORAL | Status: DC
Start: 2022-06-06 — End: 2022-06-08
  Administered 2022-06-06 – 2022-06-07 (×2): 10 mg via ORAL
  Filled 2022-06-06 (×3): qty 1

## 2022-06-06 MED ORDER — MONTELUKAST SODIUM 10 MG PO TABS
10.0000 mg | ORAL_TABLET | Freq: Every day | ORAL | Status: DC
  Administered 2022-06-06 – 2022-06-08 (×4): 10 mg via ORAL
  Filled 2022-06-06 (×4): qty 1

## 2022-06-06 MED ORDER — ACETAMINOPHEN-CODEINE 300-30 MG PO TABS: 1.0000 | ORAL_TABLET | Freq: Three times a day (TID) | ORAL | Status: DC | PRN

## 2022-06-06 MED ORDER — AMLODIPINE BESYLATE 5 MG PO TABS
5.0000 mg | ORAL_TABLET | Freq: Every day | ORAL | Status: DC
  Administered 2022-06-06 – 2022-06-08 (×4): 5 mg via ORAL
  Filled 2022-06-06 (×4): qty 1

## 2022-06-06 MED ORDER — PANTOPRAZOLE SODIUM 20 MG PO TBEC
20.0000 mg | DELAYED_RELEASE_TABLET | Freq: Every day | ORAL | Status: DC
  Administered 2022-06-06 – 2022-06-09 (×4): 20 mg via ORAL
  Filled 2022-06-06 (×4): qty 1

## 2022-06-06 MED ORDER — TAMSULOSIN HCL 0.4 MG PO CAPS: 0.4000 mg | ORAL_CAPSULE | Freq: Every day | ORAL | Status: DC

## 2022-06-06 MED ORDER — ONDANSETRON HCL 4 MG/2ML IJ SOLN: 4.0000 mg | Freq: Four times a day (QID) | INTRAMUSCULAR | Status: DC | PRN

## 2022-06-06 MED ORDER — POTASSIUM CHLORIDE 10 MEQ/100ML IV SOLN
10.0000 meq | INTRAVENOUS | Status: AC
  Administered 2022-06-06 (×3): 10 meq via INTRAVENOUS
  Filled 2022-06-06 (×2): qty 100

## 2022-06-06 MED ORDER — POTASSIUM CHLORIDE IN NACL 20-0.9 MEQ/L-% IV SOLN
INTRAVENOUS | Status: DC
Start: 2022-06-06 — End: 2022-06-09
  Filled 2022-06-06 (×7): qty 1000

## 2022-06-06 MED ORDER — ENOXAPARIN SODIUM 40 MG/0.4ML IJ SOSY
40.0000 mg | PREFILLED_SYRINGE | INTRAMUSCULAR | Status: DC
  Administered 2022-06-06 – 2022-06-09 (×4): 40 mg via SUBCUTANEOUS
  Filled 2022-06-06 (×4): qty 0.4

## 2022-06-06 MED ORDER — METOPROLOL TARTRATE 25 MG PO TABS
75.0000 mg | ORAL_TABLET | Freq: Two times a day (BID) | ORAL | Status: DC
  Administered 2022-06-06 – 2022-06-09 (×8): 75 mg via ORAL
  Filled 2022-06-06 (×8): qty 3

## 2022-06-06 MED ORDER — EZETIMIBE 10 MG PO TABS
10.0000 mg | ORAL_TABLET | Freq: Every day | ORAL | Status: DC
  Administered 2022-06-06 – 2022-06-08 (×4): 10 mg via ORAL
  Filled 2022-06-06 (×4): qty 1

## 2022-06-06 MED ORDER — DOCUSATE SODIUM 100 MG PO CAPS: 100.0000 mg | ORAL_CAPSULE | Freq: Every day | ORAL | Status: DC | PRN

## 2022-06-06 MED ORDER — ZOLPIDEM TARTRATE 5 MG PO TABS
5.0000 mg | ORAL_TABLET | Freq: Every evening | ORAL | Status: DC | PRN
  Administered 2022-06-07: 5 mg via ORAL
  Filled 2022-06-06: qty 1

## 2022-06-06 MED ORDER — ENOXAPARIN SODIUM 30 MG/0.3ML IJ SOSY: 30.0000 mg | PREFILLED_SYRINGE | INTRAMUSCULAR | Status: DC

## 2022-06-06 MED ORDER — ONDANSETRON 4 MG PO TBDP: 4.0000 mg | ORAL_TABLET | Freq: Four times a day (QID) | ORAL | Status: DC | PRN

## 2022-06-06 MED ORDER — PIPERACILLIN-TAZOBACTAM 3.375 G IVPB
3.3750 g | Freq: Three times a day (TID) | INTRAVENOUS | Status: DC
  Administered 2022-06-06 – 2022-06-09 (×11): 3.375 g via INTRAVENOUS
  Filled 2022-06-06 (×9): qty 50

## 2022-06-06 MED ORDER — FENTANYL CITRATE PF 50 MCG/ML IJ SOSY: 12.5000 ug | PREFILLED_SYRINGE | INTRAMUSCULAR | Status: DC | PRN

## 2022-06-06 NOTE — Care Plan (Signed)
Spoke to Dr Ninfa Linden about Pt's bladder scan results.  Pt had been to the bathroom more frequently and reported dribbling. Scan showed 541 in bladder.  Verbal order was given to place a foley to leave as pt is going to OR tomorrow anyway.  Upon insertion approx 873m urine filled in bag.

## 2022-06-06 NOTE — Plan of Care (Signed)
Plan of care discussed.  Requested his pills crushed D/T hx of pills getting caught in his throat.

## 2022-06-06 NOTE — Progress Notes (Signed)
Subjective/Chief Complaint: Main complaint is not being able to pee well   Objective: Vital signs in last 24 hours: Temp:  [98 F (36.7 C)-98.4 F (36.9 C)] 98.1 F (36.7 C) (09/04 0639) Pulse Rate:  [68-108] 95 (09/04 0639) Resp:  [14-19] 18 (09/04 0639) BP: (109-149)/(58-84) 149/84 (09/04 0639) SpO2:  [95 %-100 %] 98 % (09/04 0639) Weight:  [79.5 kg-79.9 kg] 79.5 kg (09/04 0109) Last BM Date : 06/06/22  Intake/Output from previous day: 09/03 0701 - 09/04 0700 In: 1468.8 [P.O.:820; I.V.:332.8; IV Piggyback:315.9] Out: -  Intake/Output this shift: No intake/output data recorded.  General appearance: alert and cooperative Resp: clear to auscultation bilaterally Cardio: regular rate and rhythm GI: soft, mild tenderness on right flank. Redness seems to have increased overnight. Continue to drain well  Lab Results:  Recent Labs    06/05/22 1819 06/06/22 0306  WBC 11.2* 11.6*  HGB 11.9* 13.4  HCT 35.4* 39.9  PLT 285 343   BMET Recent Labs    06/05/22 1819 06/06/22 0306  NA 133* 135  K 2.7* 2.7*  CL 98 101  CO2 25 24  GLUCOSE 142* 127*  BUN 20 15  CREATININE 0.89 0.76  CALCIUM 8.1* 8.4*   PT/INR No results for input(s): "LABPROT", "INR" in the last 72 hours. ABG No results for input(s): "PHART", "HCO3" in the last 72 hours.  Invalid input(s): "PCO2", "PO2"  Studies/Results: CT Abdomen Pelvis W Contrast  Result Date: 06/05/2022 CLINICAL DATA:  Abdominal pain, post-op EXAM: CT ABDOMEN AND PELVIS WITH CONTRAST TECHNIQUE: Multidetector CT imaging of the abdomen and pelvis was performed using the standard protocol following bolus administration of intravenous contrast. RADIATION DOSE REDUCTION: This exam was performed according to the departmental dose-optimization program which includes automated exposure control, adjustment of the mA and/or kV according to patient size and/or use of iterative reconstruction technique. CONTRAST:  173m OMNIPAQUE IOHEXOL 300  MG/ML  SOLN COMPARISON:  CT abdomen pelvis 05/16/2022, CT abdomen pelvis 04/04/2022 FINDINGS: Lower chest: No acute abnormality. Hepatobiliary: Subcentimeter hypodensity too small to characterize. No focal liver abnormality. Status post cholecystectomy. Interval removal of a surgical drain with slight interval increase in size of a gallbladder fossa 3 x 2 x 3 cm fluid collection with peripheral enhancement (from 2 x 1 x 1.5 cm). Common bile duct stent is again noted with proximal tip terminating within the mid common bile duct and distal tip terminating within the third portion of the duodenum. Associated pneumobilia is noted. No biliary dilatation. Pancreas: No focal lesion. Normal pancreatic contour. No surrounding inflammatory changes. No main pancreatic ductal dilatation. Spleen: Normal in size without focal abnormality. Adrenals/Urinary Tract: There is a stable 3.3 x 2.8 left adrenal gland nodule with a density of -50 Hounsfield units. Bilateral kidneys enhance symmetrically. Fluid dense lesion within left kidney likely represents a simple renal cyst. Simple renal cysts, in the absence of clinically indicated signs/symptoms, require no independent follow-up. Punctate left nephrolithiasis. No right nephrolithiasis. No ureterolithiasis bilaterally. No hydronephrosis. No hydroureter. The urinary bladder is unremarkable. On delayed imaging, there is no urothelial wall thickening and there are no filling defects in the opacified portions of the bilateral collecting systems or ureters. Stomach/Bowel: Stomach is within normal limits. No evidence of bowel wall thickening or dilatation. Appendix appears normal. Vascular/Lymphatic: No abdominal aorta or iliac aneurysm. Mild atherosclerotic plaque of the aorta and its branches. No abdominal, pelvic, or inguinal lymphadenopathy. Reproductive: The prostate is enlarged measuring up to 4.9 cm. Other: No intraperitoneal free fluid. No  intraperitoneal free gas. Surgical drain  tract noted along the right lateral abdominal wall with query developing sub cutaneous and intramuscular abscess formation measuring 6.5 x 1.9 cm). Musculoskeletal: Right lateral wall subcutaneus soft tissue edema. No suspicious lytic or blastic osseous lesions. No acute displaced fracture. Multilevel degenerative changes of the spine. IMPRESSION: 1. Interval removal of a surgical drain with slight interval increase in size of a 3 x 2 x 3 cm gallbladder fossa abscess (from 2 x 1 x 1.5 cm). 2. Patent common bile duct stent. No associated biliary ductal dilatation. Proximal tip terminating within the mid common bile duct and distal tip terminating within the third portion of the duodenum. 3. Removed surgical drain track noted along the right lateral abdominal wall with query developing sub cutaneous and intramuscular abscess formation measuring 6.5 x 1.9 cm. Recommend attention on follow-up. 4. A stable fat density 3.8 cm left adrenal gland nodule likely represents a myelolipoma. 5. Nonobstructive punctate left nephrolithiasis. 6. Prostatomegaly. 7.  Aortic Atherosclerosis (ICD10-I70.0). Electronically Signed   By: Iven Finn M.D.   On: 06/05/2022 20:23    Anti-infectives: Anti-infectives (From admission, onward)    Start     Dose/Rate Route Frequency Ordered Stop   06/06/22 0200  piperacillin-tazobactam (ZOSYN) IVPB 3.375 g        3.375 g 12.5 mL/hr over 240 Minutes Intravenous Every 8 hours 06/06/22 0058 06/13/22 0159       Assessment/Plan: s/p * No surgery found * Continue Iv abx Will make npo after midnight. If no improvement in cellulitis then may need I and D in OR tomorrow Add flomax to help with urination ambulate  LOS: 0 days    Arthur Shaw 06/06/2022

## 2022-06-07 DIAGNOSIS — Z8249 Family history of ischemic heart disease and other diseases of the circulatory system: Secondary | ICD-10-CM | POA: Diagnosis not present

## 2022-06-07 DIAGNOSIS — I10 Essential (primary) hypertension: Secondary | ICD-10-CM | POA: Diagnosis present

## 2022-06-07 DIAGNOSIS — M797 Fibromyalgia: Secondary | ICD-10-CM | POA: Diagnosis present

## 2022-06-07 DIAGNOSIS — T8140XA Infection following a procedure, unspecified, initial encounter: Secondary | ICD-10-CM | POA: Diagnosis present

## 2022-06-07 DIAGNOSIS — Z955 Presence of coronary angioplasty implant and graft: Secondary | ICD-10-CM | POA: Diagnosis not present

## 2022-06-07 DIAGNOSIS — Z9884 Bariatric surgery status: Secondary | ICD-10-CM | POA: Diagnosis not present

## 2022-06-07 DIAGNOSIS — E871 Hypo-osmolality and hyponatremia: Secondary | ICD-10-CM | POA: Diagnosis present

## 2022-06-07 DIAGNOSIS — Z9049 Acquired absence of other specified parts of digestive tract: Secondary | ICD-10-CM | POA: Diagnosis not present

## 2022-06-07 DIAGNOSIS — Y838 Other surgical procedures as the cause of abnormal reaction of the patient, or of later complication, without mention of misadventure at the time of the procedure: Secondary | ICD-10-CM | POA: Diagnosis present

## 2022-06-07 DIAGNOSIS — E785 Hyperlipidemia, unspecified: Secondary | ICD-10-CM | POA: Diagnosis present

## 2022-06-07 DIAGNOSIS — R1310 Dysphagia, unspecified: Secondary | ICD-10-CM | POA: Diagnosis present

## 2022-06-07 DIAGNOSIS — L02211 Cutaneous abscess of abdominal wall: Secondary | ICD-10-CM | POA: Diagnosis present

## 2022-06-07 DIAGNOSIS — R338 Other retention of urine: Secondary | ICD-10-CM | POA: Diagnosis present

## 2022-06-07 DIAGNOSIS — F0390 Unspecified dementia without behavioral disturbance: Secondary | ICD-10-CM | POA: Diagnosis present

## 2022-06-07 DIAGNOSIS — K219 Gastro-esophageal reflux disease without esophagitis: Secondary | ICD-10-CM | POA: Diagnosis present

## 2022-06-07 DIAGNOSIS — Z7902 Long term (current) use of antithrombotics/antiplatelets: Secondary | ICD-10-CM | POA: Diagnosis not present

## 2022-06-07 DIAGNOSIS — Z79899 Other long term (current) drug therapy: Secondary | ICD-10-CM | POA: Diagnosis not present

## 2022-06-07 DIAGNOSIS — I2510 Atherosclerotic heart disease of native coronary artery without angina pectoris: Secondary | ICD-10-CM | POA: Diagnosis present

## 2022-06-07 DIAGNOSIS — R339 Retention of urine, unspecified: Secondary | ICD-10-CM | POA: Diagnosis not present

## 2022-06-07 DIAGNOSIS — N401 Enlarged prostate with lower urinary tract symptoms: Secondary | ICD-10-CM | POA: Diagnosis present

## 2022-06-07 DIAGNOSIS — M109 Gout, unspecified: Secondary | ICD-10-CM | POA: Diagnosis present

## 2022-06-07 DIAGNOSIS — Z87891 Personal history of nicotine dependence: Secondary | ICD-10-CM | POA: Diagnosis not present

## 2022-06-07 DIAGNOSIS — L03311 Cellulitis of abdominal wall: Secondary | ICD-10-CM | POA: Diagnosis present

## 2022-06-07 DIAGNOSIS — E669 Obesity, unspecified: Secondary | ICD-10-CM | POA: Diagnosis present

## 2022-06-07 DIAGNOSIS — R972 Elevated prostate specific antigen [PSA]: Secondary | ICD-10-CM | POA: Diagnosis present

## 2022-06-07 DIAGNOSIS — E876 Hypokalemia: Secondary | ICD-10-CM | POA: Diagnosis present

## 2022-06-07 MED ORDER — FINASTERIDE 5 MG PO TABS: 5.0000 mg | ORAL_TABLET | Freq: Every day | ORAL | 3 refills | Status: AC

## 2022-06-07 MED ORDER — FINASTERIDE 5 MG PO TABS
5.0000 mg | ORAL_TABLET | Freq: Every day | ORAL | Status: DC
  Administered 2022-06-07 – 2022-06-09 (×3): 5 mg via ORAL
  Filled 2022-06-07 (×3): qty 1

## 2022-06-07 MED ORDER — SILODOSIN 8 MG PO CAPS: 8.0000 mg | ORAL_CAPSULE | Freq: Every day | ORAL | 3 refills | Status: AC

## 2022-06-07 MED ORDER — SILODOSIN 4 MG PO CAPS
8.0000 mg | ORAL_CAPSULE | Freq: Every day | ORAL | Status: DC
  Administered 2022-06-07 – 2022-06-09 (×3): 8 mg via ORAL
  Filled 2022-06-07 (×3): qty 2

## 2022-06-07 MED ORDER — NON FORMULARY: 8.0000 mg | Freq: Every day | Status: DC

## 2022-06-07 NOTE — Progress Notes (Signed)
Assessment & Plan: Status post lap chole 04/05/2022 - Dr. 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  Will monitor right abdominal wall drainage and continue IV abx's for now.  Urology to see for urinary retention.  Speech path to see for pill dysphagia.        Armandina Gemma, MD [facility] [place] [facility] Office: [phone]        Chief Complaint: Abdominal wall abscess  Subjective: Patient in bed, more comfortable since spontaneous drainage from right abdominal wall.  Objective: Vital signs in last 24 hours: Temp:  [97.7 F (36.5 C)-98.5 F (36.9 C)] 98.5 F (36.9 C) (09/05 0512) Pulse Rate:  [75-99] 84 (09/05 0851) Resp:  [16-20] 20 (09/05 0512) BP: (121-145)/(61-90) 128/73 (09/05 0851) SpO2:  [97 %-99 %] 97 % (09/05 0512) Last BM Date : 06/06/22  Intake/Output from previous day: 09/04 0701 - 09/05 0700 In: 1928.4 [I.V.:1913.6; IV Piggyback:14.9] Out: 3100 [Urine:3100] Intake/Output this shift: No intake/output data recorded.  Physical Exam: HEENT - sclerae clear, mucous membranes moist Neck - soft Abdomen - soft without distension; dressing right upper abd wall with creamy tan output Ext - no edema, non-tender Neuro - alert & oriented, no focal deficits  Lab Results:  Recent Labs    06/05/22 1819 06/06/22 0306  WBC 11.2* 11.6*  HGB 11.9* 13.4  HCT 35.4* 39.9  PLT 285 343   BMET Recent Labs    06/05/22 1819 06/06/22 0306  NA 133* 135  K 2.7* 2.7*  CL 98 101  CO2 25 24  GLUCOSE 142* 127*  BUN 20 15  CREATININE 0.89 0.76  CALCIUM 8.1* 8.4*   PT/INR No results for input(s): "LABPROT", "INR" in the last 72  hours. Comprehensive Metabolic Panel:    Component Value Date/Time   NA 135 06/06/2022 0306   NA 133 (L) 06/05/2022 1819   NA 137 12/14/2017 0000   K 2.7 (LL) 06/06/2022 0306   K 2.7 (LL) 06/05/2022 1819   CL 101 06/06/2022 0306   CL 98 06/05/2022 1819   CO2 24 06/06/2022 0306   CO2 25 06/05/2022 1819   BUN 15 06/06/2022 0306   BUN 20 06/05/2022 1819   BUN 11 12/14/2017 0000   CREATININE 0.76 06/06/2022 0306   CREATININE 0.89 06/05/2022 1819   GLUCOSE 127 (H) 06/06/2022 0306   GLUCOSE 142 (H) 06/05/2022 1819   CALCIUM 8.4 (L) 06/06/2022 0306   CALCIUM 8.1 (L) 06/05/2022 1819   AST 15 06/06/2022 0306   AST 15 06/05/2022 1819   ALT 13 06/06/2022 0306   ALT 14 06/05/2022 1819   ALKPHOS 68 06/06/2022 0306   ALKPHOS 70 06/05/2022 1819   BILITOT 1.5 (H) 06/06/2022 0306   BILITOT 1.4 (H) 06/05/2022 1819   BILITOT 0.5 12/14/2017 0000   PROT 6.7 06/06/2022 0306   PROT 6.1 (L) 06/05/2022 1819   PROT 6.8 12/14/2017 0000   ALBUMIN 3.2 (L) 06/06/2022 0306   ALBUMIN 2.9 (L) 06/05/2022 1819   ALBUMIN 4.0 12/14/2017 0000    Studies/Results: CT Abdomen Pelvis W Contrast  Result Date: 06/05/2022 CLINICAL DATA:  Abdominal pain, post-op EXAM: CT ABDOMEN AND PELVIS WITH CONTRAST TECHNIQUE: Multidetector CT imaging  of the abdomen and pelvis was performed using the standard protocol following bolus administration of intravenous contrast. RADIATION DOSE REDUCTION: This exam was performed according to the departmental dose-optimization program which includes automated exposure control, adjustment of the mA and/or kV according to patient size and/or use of iterative reconstruction technique. CONTRAST:  129m OMNIPAQUE IOHEXOL 300 MG/ML  SOLN COMPARISON:  CT abdomen pelvis 05/16/2022, CT abdomen pelvis 04/04/2022 FINDINGS: Lower chest: No acute abnormality. Hepatobiliary: Subcentimeter hypodensity too small to characterize. No focal liver abnormality. Status post cholecystectomy. Interval removal of a  surgical drain with slight interval increase in size of a gallbladder fossa 3 x 2 x 3 cm fluid collection with peripheral enhancement (from 2 x 1 x 1.5 cm). Common bile duct stent is again noted with proximal tip terminating within the mid common bile duct and distal tip terminating within the third portion of the duodenum. Associated pneumobilia is noted. No biliary dilatation. Pancreas: No focal lesion. Normal pancreatic contour. No surrounding inflammatory changes. No main pancreatic ductal dilatation. Spleen: Normal in size without focal abnormality. Adrenals/Urinary Tract: There is a stable 3.3 x 2.8 left adrenal gland nodule with a density of -50 Hounsfield units. Bilateral kidneys enhance symmetrically. Fluid dense lesion within left kidney likely represents a simple renal cyst. Simple renal cysts, in the absence of clinically indicated signs/symptoms, require no independent follow-up. Punctate left nephrolithiasis. No right nephrolithiasis. No ureterolithiasis bilaterally. No hydronephrosis. No hydroureter. The urinary bladder is unremarkable. On delayed imaging, there is no urothelial wall thickening and there are no filling defects in the opacified portions of the bilateral collecting systems or ureters. Stomach/Bowel: Stomach is within normal limits. No evidence of bowel wall thickening or dilatation. Appendix appears normal. Vascular/Lymphatic: No abdominal aorta or iliac aneurysm. Mild atherosclerotic plaque of the aorta and its branches. No abdominal, pelvic, or inguinal lymphadenopathy. Reproductive: The prostate is enlarged measuring up to 4.9 cm. Other: No intraperitoneal free fluid. No intraperitoneal free gas. Surgical drain tract noted along the right lateral abdominal wall with query developing sub cutaneous and intramuscular abscess formation measuring 6.5 x 1.9 cm). Musculoskeletal: Right lateral wall subcutaneus soft tissue edema. No suspicious lytic or blastic osseous lesions. No acute  displaced fracture. Multilevel degenerative changes of the spine. IMPRESSION: 1. Interval removal of a surgical drain with slight interval increase in size of a 3 x 2 x 3 cm gallbladder fossa abscess (from 2 x 1 x 1.5 cm). 2. Patent common bile duct stent. No associated biliary ductal dilatation. Proximal tip terminating within the mid common bile duct and distal tip terminating within the third portion of the duodenum. 3. Removed surgical drain track noted along the right lateral abdominal wall with query developing sub cutaneous and intramuscular abscess formation measuring 6.5 x 1.9 cm. Recommend attention on follow-up. 4. A stable fat density 3.8 cm left adrenal gland nodule likely represents a myelolipoma. 5. Nonobstructive punctate left nephrolithiasis. 6. Prostatomegaly. 7.  Aortic Atherosclerosis (ICD10-I70.0). Electronically Signed   By: MIven FinnM.D.   On: 06/05/2022 20:23      TArmandina Gemma9/01/2022   Patient ID: TFranki Cabot male   DOB: 304-11-1945 76y.o.   MRN: [id number]

## 2022-06-07 NOTE — Plan of Care (Signed)
  Problem: Activity: Goal: Risk for activity intolerance will decrease Outcome: Progressing   Problem: Pain Managment: Goal: General experience of comfort will improve Outcome: Progressing   Problem: Safety: Goal: Ability to remain free from injury will improve Outcome: Progressing   

## 2022-06-07 NOTE — Progress Notes (Signed)
Transition of Care [facility]) Screening Note  Patient Details  Name: Arthur Shaw Date of Birth: [birth year]  Transition of Care [facility]) CM/SW Contact:    Sherie Don, LCSW Phone Number: 06/07/2022, 9:20 AM  Transition of Care Department [facility]) has reviewed patient and no TOC needs have been identified at this time. We will continue to monitor patient advancement through interdisciplinary progression rounds. If new patient transition needs arise, please place a TOC consult.

## 2022-06-07 NOTE — Consult Note (Signed)
Reason for Consult:Urinary Retention / Large Prostate, Elevated PSA  Referring Physician: Coletta Memos MD  Arthur Shaw is an 76 y.o. male.   HPI:   1 - Urinary Retention / Large Prostate - long  h/o irritativ and obstructive LUTS on tamsulosin at baseline but hard to swallow. He can crush pills / sprinke capsule contents. Prostate vol 74m by CT 06/2022 (moderate enlargement), no median. Developed retention during hospitalization for abd wall infection. Starting finasteride (very very small tablet)  + '8mg'$  silodosin (capsule can be open and sprinkled into food)  2 - Elevated PSA - Long h/o elevatd PSA followed by Arthur Shaw. 2017 - PSA 5.5 2021 - PSA 6.5  PMH sig for dementia, TNA, chole. Wife retired RTherapist, sports  He is retired from [employer] His PCP is Dr. SFelipa Eth   Today " Arthur Shaw is seen in consultation for above. Now with catheter after episode retention.   Past Medical History:  Diagnosis Date   Adenomatous colon polyp    4/06 FS--[facility]; 9/07 colon: [facility] sm aden p   ASCVD (arteriosclerotic cardiovascular disease)    single vessel, s/p DE stent to mid LAD 4/06   Essential hypertension, benign    Fibromyalgia    questionable   Former smoker, stopped smoking in distant past    GERD (gastroesophageal reflux disease)    UGI 09/2012   Gout    H/O heart artery stent    Hyperlipidemia    [facility]  Obesity    Prediabetes    S/P gastric sleeve procedure    Statin intolerance     Past Surgical History:  Procedure Laterality Date   BILIARY STENT PLACEMENT N/A 04/29/2022   Procedure: BILIARY STENT PLACEMENT;  Surgeon: MClarene Essex MD;  [facility]: [facility];  Service: Gastroenterology;  Laterality: N/A;   CHOLECYSTECTOMY N/A 04/05/2022   Procedure: LAPAROSCOPIC CHOLECYSTECTOMY;  Surgeon: ADwan Bolt MD;  [facility]  Service: General;  Laterality: N/A;   CORONARY STENT PLACEMENT     taxus stent placed-Dr. EIlda FoilLAS  01/2005   ENDOSCOPIC RETROGRADE CHOLANGIOPANCREATOGRAPHY (ERCP) WITH PROPOFOL N/A 04/29/2022   Procedure: ENDOSCOPIC RETROGRADE CHOLANGIOPANCREATOGRAPHY (ERCP) WITH PROPOFOL;  Surgeon: MClarene Essex MD;  [facility];  Service: Gastroenterology;  Laterality: N/A;   REMOVAL OF STONES  04/29/2022   Procedure: Balloon Sweep of CBD;  Surgeon: MClarene Essex MD;  [facility];  Service: Gastroenterology;;   SJoan Mayans 04/29/2022   Procedure: SJoan Mayans  Surgeon: MClarene Essex MD;  [facility];  Service: Gastroenterology;;   STOMACH SURGERY     bariatric surgery 05/19/13   TONSILLECTOMY      Family History  Problem Relation Age of Onset   Dementia Mother    Arrhythmia Father        afib   Heart failure Father    Fibromyalgia Sister    Hyperlipidemia Brother    CAD Brother    Drug abuse Son     Social History:  reports that he has never smoked. He has never used smokeless tobacco. He reports current alcohol use. He reports that he does not use drugs.  Allergies:  Allergies  Allergen Reactions   Morphine     Pt was not himself, wife states he was "Bonkers"   Statins     memory fatigue   Welchol [Colesevelam Hcl]     Muscle aches    Medications: I have reviewed the patient's current medications.  Results for orders placed  or performed during the hospital encounter of 06/05/22 (from the past 48 hour(s))  CBC with Differential     Status: Abnormal   Collection Time: 06/05/22  6:19 PM  Result Value Ref Range   WBC 11.2 (H) 4.0 - 10.5 K/uL   RBC 4.21 (L) 4.22 - 5.81 MIL/uL   Hemoglobin 11.9 (L) 13.0 - 17.0 g/dL   HCT 35.4 (L) 39.0 - 52.0 %   MCV 84.1 80.0 - 100.0 fL   MCH 28.3 26.0 - 34.0 pg   MCHC 33.6 30.0 - 36.0 g/dL   RDW 17.1 (H) 11.5 - 15.5 %   Platelets 285 150 - 400 K/uL   nRBC 0.0 0.0 - 0.2 %   Neutrophils Relative % 83 %   Neutro Abs 9.3 (H) 1.7 - 7.7 K/uL   Lymphocytes Relative 9 %   Lymphs Abs 1.0 0.7 - 4.0 K/uL   Monocytes  Relative 8 %   Monocytes Absolute 0.9 0.1 - 1.0 K/uL   Eosinophils Relative 0 %   Eosinophils Absolute 0.0 0.0 - 0.5 K/uL   Basophils Relative 0 %   Basophils Absolute 0.0 0.0 - 0.1 K/uL   Immature Granulocytes 0 %   Abs Immature Granulocytes 0.03 0.00 - 0.07 K/uL    Comment: Performed at [facility], [address] [place], [place] [address]  Comprehensive metabolic panel     Status: Abnormal   Collection Time: 06/05/22  6:19 PM  Result Value Ref Range   Sodium 133 (L) 135 - 145 mmol/L   Potassium 2.7 (LL) 3.5 - 5.1 mmol/L    Comment: CRITICAL RESULT CALLED TO, READ BACK BY AND VERIFIED WITH POWELL,V RN '@1906'$  06/05/22 EDENSCA   Chloride 98 98 - 111 mmol/L   CO2 25 22 - 32 mmol/L   Glucose, Bld 142 (H) 70 - 99 mg/dL    Comment: Glucose reference range applies only to samples taken after fasting for at least 8 hours.   BUN 20 8 - 23 mg/dL   Creatinine, Ser 0.89 0.61 - 1.24 mg/dL   Calcium 8.1 (L) 8.9 - 10.3 mg/dL   Total Protein 6.1 (L) 6.5 - 8.1 g/dL   Albumin 2.9 (L) 3.5 - 5.0 g/dL   AST 15 15 - 41 U/L   ALT 14 0 - 44 U/L   Alkaline Phosphatase 70 38 - 126 U/L   Total Bilirubin 1.4 (H) 0.3 - 1.2 mg/dL   GFR, Estimated >60 >60 mL/min    Comment: (NOTE) Calculated using the CKD-EPI Creatinine Equation (2021)    Anion gap 10 5 - 15    Comment: Performed at [facility], [address] [place], [place] [address]  Surgical PCR screen     Status: None   Collection Time: 06/06/22  1:23 AM   Specimen: Nasal Mucosa; Nasal Swab  Result Value Ref Range   MRSA, PCR NEGATIVE NEGATIVE   Staphylococcus aureus NEGATIVE NEGATIVE    Comment: (NOTE) The Xpert SA Assay (FDA approved for NASAL specimens in patients 85 years of age and older), is one component of a comprehensive surveillance program. It is not intended to diagnose infection nor to guide or monitor treatment. Performed at [facility], [place] [address], [place], [place] [address]    CBC     Status: Abnormal   Collection Time: 06/06/22  3:06 AM  Result Value Ref Range   WBC 11.6 (H) 4.0 - 10.5 K/uL   RBC 4.60 4.22 -  5.81 MIL/uL   Hemoglobin 13.4 13.0 - 17.0 g/dL   HCT 39.9 39.0 - 52.0 %   MCV 86.7 80.0 - 100.0 fL   MCH 29.1 26.0 - 34.0 pg   MCHC 33.6 30.0 - 36.0 g/dL   RDW 17.2 (H) 11.5 - 15.5 %   Platelets 343 150 - 400 K/uL   nRBC 0.0 0.0 - 0.2 %    Comment: Performed at [facility], [place] [address], [place], [place] [address]  Comprehensive metabolic panel     Status: Abnormal   Collection Time: 06/06/22  3:06 AM  Result Value Ref Range   Sodium 135 135 - 145 mmol/L   Potassium 2.7 (LL) 3.5 - 5.1 mmol/L    Comment: CRITICAL RESULT CALLED TO, READ BACK BY AND VERIFIED WITH KNAPP,DAVID 06/06/22 '@0349'$  BY SEEL,M.    Chloride 101 98 - 111 mmol/L   CO2 24 22 - 32 mmol/L   Glucose, Bld 127 (H) 70 - 99 mg/dL    Comment: Glucose reference range applies only to samples taken after fasting for at least 8 hours.   BUN 15 8 - 23 mg/dL   Creatinine, Ser 0.76 0.61 - 1.24 mg/dL   Calcium 8.4 (L) 8.9 - 10.3 mg/dL   Total Protein 6.7 6.5 - 8.1 g/dL   Albumin 3.2 (L) 3.5 - 5.0 g/dL   AST 15 15 - 41 U/L   ALT 13 0 - 44 U/L   Alkaline Phosphatase 68 38 - 126 U/L   Total Bilirubin 1.5 (H) 0.3 - 1.2 mg/dL   GFR, Estimated >60 >60 mL/min    Comment: (NOTE) Calculated using the CKD-EPI Creatinine Equation (2021)    Anion gap 10 5 - 15    Comment: Performed at [facility], [place] [address], [place], [place] [address]  Magnesium     Status: None   Collection Time: 06/06/22  3:06 AM  Result Value Ref Range   Magnesium 1.7 1.7 - 2.4 mg/dL    Comment: Performed at [facility], [place] [address] [place], [place] [address]    CT Abdomen Pelvis W Contrast  Result Date: 06/05/2022 CLINICAL DATA:  Abdominal pain, post-op EXAM: CT ABDOMEN AND PELVIS WITH CONTRAST TECHNIQUE: Multidetector CT imaging of the abdomen and pelvis  was performed using the standard protocol following bolus administration of intravenous contrast. RADIATION DOSE REDUCTION: This exam was performed according to the departmental dose-optimization program which includes automated exposure control, adjustment of the mA and/or kV according to patient size and/or use of iterative reconstruction technique. CONTRAST:  179m OMNIPAQUE IOHEXOL 300 MG/ML  SOLN COMPARISON:  CT abdomen pelvis 05/16/2022, CT abdomen pelvis 04/04/2022 FINDINGS: Lower chest: No acute abnormality. Hepatobiliary: Subcentimeter hypodensity too small to characterize. No focal liver abnormality. Status post cholecystectomy. Interval removal of a surgical drain with slight interval increase in size of a gallbladder fossa 3 x 2 x 3 cm fluid collection with peripheral enhancement (from 2 x 1 x 1.5 cm). Common bile duct stent is again noted with proximal tip terminating within the mid common bile duct and distal tip terminating within the third portion of the duodenum. Associated pneumobilia is noted. No biliary dilatation. Pancreas: No focal lesion. Normal pancreatic contour. No surrounding inflammatory changes. No main pancreatic ductal dilatation. Spleen: Normal in size without focal abnormality. Adrenals/Urinary Tract: There is a stable 3.3 x 2.8 left adrenal gland nodule with a density of -50 Hounsfield units. Bilateral kidneys enhance symmetrically. Fluid dense lesion within left  kidney likely represents a simple renal cyst. Simple renal cysts, in the absence of clinically indicated signs/symptoms, require no independent follow-up. Punctate left nephrolithiasis. No right nephrolithiasis. No ureterolithiasis bilaterally. No hydronephrosis. No hydroureter. The urinary bladder is unremarkable. On delayed imaging, there is no urothelial wall thickening and there are no filling defects in the opacified portions of the bilateral collecting systems or ureters. Stomach/Bowel: Stomach is within normal  limits. No evidence of bowel wall thickening or dilatation. Appendix appears normal. Vascular/Lymphatic: No abdominal aorta or iliac aneurysm. Mild atherosclerotic plaque of the aorta and its branches. No abdominal, pelvic, or inguinal lymphadenopathy. Reproductive: The prostate is enlarged measuring up to 4.9 cm. Other: No intraperitoneal free fluid. No intraperitoneal free gas. Surgical drain tract noted along the right lateral abdominal wall with query developing sub cutaneous and intramuscular abscess formation measuring 6.5 x 1.9 cm). Musculoskeletal: Right lateral wall subcutaneus soft tissue edema. No suspicious lytic or blastic osseous lesions. No acute displaced fracture. Multilevel degenerative changes of the spine. IMPRESSION: 1. Interval removal of a surgical drain with slight interval increase in size of a 3 x 2 x 3 cm gallbladder fossa abscess (from 2 x 1 x 1.5 cm). 2. Patent common bile duct stent. No associated biliary ductal dilatation. Proximal tip terminating within the mid common bile duct and distal tip terminating within the third portion of the duodenum. 3. Removed surgical drain track noted along the right lateral abdominal wall with query developing sub cutaneous and intramuscular abscess formation measuring 6.5 x 1.9 cm. Recommend attention on follow-up. 4. A stable fat density 3.8 cm left adrenal gland nodule likely represents a myelolipoma. 5. Nonobstructive punctate left nephrolithiasis. 6. Prostatomegaly. 7.  Aortic Atherosclerosis (ICD10-I70.0). Electronically Signed   By: Iven Finn M.D.   On: 06/05/2022 20:23    Review of Systems Blood pressure 128/73, pulse 84, temperature 98.5 F (36.9 C), temperature source Oral, resp. rate 20, height '5\' 10"'$  (1.778 m), weight 79.5 kg, SpO2 97 %. Physical Exam Vitals reviewed.  Constitutional:      Comments: Some mild stigmata of demntia. Very pleasant.   HENT:     Head: Normocephalic.     Nose: Nose normal.  Eyes:     Pupils:  Pupils are equal, round, and reactive to light.  Abdominal:     Comments: Rt sided abd dressign in place with spotting. Non foul.   Genitourinary:    Comments: Silicone catheter in place with clear yellow urine.  Musculoskeletal:     Cervical back: Normal range of motion.  Skin:    General: Skin is warm.  Neurological:     Mental Status: He is alert.  Psychiatric:        Mood and Affect: Mood normal.     Assessment/Plan:  Likely BPH by h/o and very slow PSA rise, though low grade CA possilbe. Rec continue foley, beting finasteride (very very small tablet)+ silodosin (caplet can be open and sprinkled) now and to continue at discharge. WE will request office visit voiding trial in about 2 weeks. In the long term, finasteride will be best medial therapy at his gland >50gm.    Alexis Frock 06/07/2022, 10:31 AM

## 2022-06-08 LAB — BASIC METABOLIC PANEL
Anion gap: 8 (ref 5–15)
BUN: 6 mg/dL — ABNORMAL LOW (ref 8–23)
CO2: 23 mmol/L (ref 22–32)
Calcium: 8.2 mg/dL — ABNORMAL LOW (ref 8.9–10.3)
Chloride: 109 mmol/L (ref 98–111)
Creatinine, Ser: 0.63 mg/dL (ref 0.61–1.24)
GFR, Estimated: >60 mL/min (ref >60)
Glucose, Bld: 94 mg/dL (ref 70–99)
Potassium: 2.8 mmol/L — ABNORMAL LOW (ref 3.5–5.1)
Sodium: 140 mmol/L (ref 135–145)

## 2022-06-08 LAB — CBC
HCT: 35.4 % — ABNORMAL LOW (ref 39.0–52.0)
Hemoglobin: 12.1 g/dL — ABNORMAL LOW (ref 13.0–17.0)
MCH: 29.7 pg (ref 26.0–34.0)
MCHC: 34.2 g/dL (ref 30.0–36.0)
MCV: 86.8 fL (ref 80.0–100.0)
Platelets: 324 10*3/uL (ref 150–400)
RBC: 4.08 MIL/uL — ABNORMAL LOW (ref 4.22–5.81)
RDW: 17 % — ABNORMAL HIGH (ref 11.5–15.5)
WBC: 5.2 10*3/uL (ref 4.0–10.5)
nRBC: 0 % (ref 0.0–0.2)

## 2022-06-08 MED ORDER — ROSUVASTATIN CALCIUM 10 MG PO TABS
10.0000 mg | ORAL_TABLET | Freq: Every day | ORAL | Status: DC
  Administered 2022-06-08: 10 mg via ORAL
  Filled 2022-06-08: qty 1

## 2022-06-08 MED ORDER — POTASSIUM CHLORIDE 20 MEQ PO PACK
40.0000 meq | PACK | Freq: Two times a day (BID) | ORAL | Status: AC
Start: 2022-06-08 — End: 2022-06-08
  Administered 2022-06-08 (×2): 40 meq via ORAL
  Filled 2022-06-08 (×2): qty 2

## 2022-06-08 MED ORDER — CHLORHEXIDINE GLUCONATE CLOTH 2 % EX PADS
6.0000 | MEDICATED_PAD | Freq: Every day | CUTANEOUS | Status: DC
  Administered 2022-06-08: 6 via TOPICAL

## 2022-06-08 MED ORDER — CLOPIDOGREL BISULFATE 75 MG PO TABS
75.0000 mg | ORAL_TABLET | Freq: Every day | ORAL | Status: DC
  Administered 2022-06-08 – 2022-06-09 (×2): 75 mg via ORAL
  Filled 2022-06-08 (×2): qty 1

## 2022-06-08 MED ORDER — MELATONIN 5 MG PO TABS
5.0000 mg | ORAL_TABLET | Freq: Every day | ORAL | Status: DC
  Administered 2022-06-08: 5 mg via ORAL
  Filled 2022-06-08: qty 1

## 2022-06-08 NOTE — Evaluation (Addendum)
SLP Cancellation Note  Patient Details Name: Arthur Shaw MRN: [id number] DOB: 1946/06/14   Cancelled treatment:       Reason Eval/Treat Not Completed: Other (comment);Patient at procedure or test/unavailable (pt in with surgery team at this time; will continue efforts) Kathleen Lime, MS [facility] SLP [place] Office [phone] Pager [phone]   Macario Golds 06/08/2022, 10:34 AM

## 2022-06-08 NOTE — Plan of Care (Signed)
  Problem: Education: Goal: Knowledge of General Education information will improve Description Including pain rating scale, medication(s)/side effects and non-pharmacologic comfort measures Outcome: Progressing   

## 2022-06-08 NOTE — Progress Notes (Addendum)
Subjective: Feeling well today.  Out walking with his wife in the halls.  Erythema around old drain site much improved.  Awaiting SLP to see him for some dysphagia.  Wife has some questions regarding his foley and voiding trials etc.  Doesn't want anymore ambien as he called his wife at 3:12am this morning.  ROS: See above, otherwise other systems negative  Objective: Vital signs in last 24 hours: Temp:  [97.9 F (36.6 C)-98.2 F (36.8 C)] 97.9 F (36.6 C) (09/06 0519) Pulse Rate:  [68-81] 81 (09/06 0806) Resp:  [17-18] 17 (09/06 0519) BP: (127-139)/(70-85) 139/70 (09/06 0806) SpO2:  [97 %-100 %] 97 % (09/06 0519) Last BM Date : 06/08/22  Intake/Output from previous day: 09/05 0701 - 09/06 0700 In: 2462.4 [P.O.:840; I.V.:1407.5; IV Piggyback:214.9] Out: 2300 [Urine:2300] Intake/Output this shift: Total I/O In: 783.9 [P.O.:240; I.V.:508.8; IV Piggyback:35.1] Out: 49 [Urine:575]  PE: Gen: NAD, ambulating well Lungs: respiratory effort nonlabored Abd: old drain site with minimal purulent drainage present.  No bile leakage through this site.  Erythema has resolved.  Nontender in his abodmen.  Nondistended.  Lab Results:  Recent Labs    06/06/22 0306 06/08/22 0311  WBC 11.6* 5.2  HGB 13.4 12.1*  HCT 39.9 35.4*  PLT 343 324   BMET Recent Labs    06/06/22 0306 06/08/22 0311  NA 135 140  K 2.7* 2.8*  CL 101 109  CO2 24 23  GLUCOSE 127* 94  BUN 15 6*  CREATININE 0.76 0.63  CALCIUM 8.4* 8.2*   PT/INR No results for input(s): "LABPROT", "INR" in the last 72 hours. CMP     Component Value Date/Time   NA 140 06/08/2022 0311   NA 137 12/14/2017 0000   K 2.8 (L) 06/08/2022 0311   CL 109 06/08/2022 0311   CO2 23 06/08/2022 0311   GLUCOSE 94 06/08/2022 0311   BUN 6 (L) 06/08/2022 0311   BUN 11 12/14/2017 0000   CREATININE 0.63 06/08/2022 0311   CALCIUM 8.2 (L) 06/08/2022 0311   PROT 6.7 06/06/2022 0306   PROT 6.8 12/14/2017 0000   ALBUMIN 3.2 (L)  06/06/2022 0306   ALBUMIN 4.0 12/14/2017 0000   AST 15 06/06/2022 0306   ALT 13 06/06/2022 0306   ALKPHOS 68 06/06/2022 0306   BILITOT 1.5 (H) 06/06/2022 0306   BILITOT 0.5 12/14/2017 0000   GFRNONAA >60 06/08/2022 0311   GFRAA 109 12/14/2017 0000   Lipase     Component Value Date/Time   LIPASE 23 04/04/2022 0420       Studies/Results: No results found.  Anti-infectives: Anti-infectives (From admission, onward)    Start     Dose/Rate Route Frequency Ordered Stop   06/06/22 0200  piperacillin-tazobactam (ZOSYN) IVPB 3.375 g        3.375 g 12.5 mL/hr over 240 Minutes Intravenous Every 8 hours 06/06/22 0058 06/13/22 0159        Assessment/Plan Abscess of right abdominal wall along JP drain tract, s/p fenestrated lap chole with post op bile leak by Dr. Zenia Resides on 04/05/22 -stent remains in place in his bile duct -spontaneous drainage of right abdominal wall abscess with minimal further drainage and resolved erythema. -cont solid diet, but SLP consult pending for some concerns for dysphagia -no surgical needs for abdominal wall collection at this time, cont abx therapy  FEN - regular/IVFs/hypokalemia - replace today and check BMET In am VTE - Plavix, resume today given no procedures indicated at this time  ID - zosyn  Urinary retention - foley in place.  Uro saw yesterday and rec foley x2 weeks with voiding trial at the office.  Wife has some questions.  Have reached out to Dr. Tresa Moore regarding this Dysphagia - SLP to see today for further recommendations  I reviewed Consultant urology notes, last 24 h vitals and pain scores, last 48 h intake and output, last 24 h labs and trends, and last 24 h imaging results.   LOS: 1 day    Arthur Shaw , [facility] Surgery 06/08/2022, 11:06 AM Please see Amion for pager number during day hours 7:00am-4:30pm or 7:00am -11:30am on weekends

## 2022-06-08 NOTE — Plan of Care (Addendum)
Pt alert and oriented x 2-3. Pt unable to sleep and requested a sleep aide. Ambien given from PRN list. Pt had confusion and forgetfulness during overnight. Pt woke up looking for wife and exited bed. RN rapidly entered room. Pt standing by bed. Pt ambulated to bathroom confusion noted. Pt put back to bed. Bed alarm setting increased to medium. Pt awoke again at approx 0200 trying to get out of bed to go find wife. Pt reoriented that he was in the hospital. At approx 0300 IV went off. RN entered room pt trying to get up. Pt ambulated to BR and put back in bed. Pt called wife at approx 0315 per wifes record. Pt told wife he thought he fell. RN later entered room approx [phone]. Assisted pt to bathroom. Pt asked if he fell during the night. RN informed patient he didn't fall but was confused to his location and time. Earlier in the night. Wife called by RN and informed that the patient didn't fall. That Lorrin Mais was given and pt later had confusion. Wife stated pt is sensitive to medication and requested for Ambien not to be given. Pt normally takes lunesta at home every night. Charge Nurse Mortimer Fries made aware.  Problem: Education: Goal: Knowledge of General Education information will improve Description: Including pain rating scale, medication(s)/side effects and non-pharmacologic comfort measures Outcome: Progressing   Problem: Health Behavior/Discharge Planning: Goal: Ability to manage health-related needs will improve Outcome: Progressing   Problem: Clinical Measurements: Goal: Ability to maintain clinical measurements within normal limits will improve Outcome: Progressing Goal: Will remain free from infection Outcome: Progressing Goal: Diagnostic test results will improve Outcome: Progressing Goal: Respiratory complications will improve Outcome: Progressing Goal: Cardiovascular complication will be avoided Outcome: Progressing   Problem: Activity: Goal: Risk for activity intolerance will  decrease Outcome: Progressing   Problem: Nutrition: Goal: Adequate nutrition will be maintained Outcome: Progressing   Problem: Coping: Goal: Level of anxiety will decrease Outcome: Progressing   Problem: Elimination: Goal: Will not experience complications related to bowel motility Outcome: Progressing Goal: Will not experience complications related to urinary retention Outcome: Progressing   Problem: Pain Managment: Goal: General experience of comfort will improve Outcome: Progressing   Problem: Safety: Goal: Ability to remain free from injury will improve Outcome: Progressing   Problem: Skin Integrity: Goal: Risk for impaired skin integrity will decrease Outcome: Progressing

## 2022-06-08 NOTE — Evaluation (Signed)
Clinical/Bedside Swallow Evaluation Patient Details  Name: Arthur Shaw MRN: [id number] Date of Birth: 22-Jul-1946  Today's Date: 06/08/2022 Time: SLP Start Time (ACUTE ONLY): 8338 SLP Stop Time (ACUTE ONLY): 2505 SLP Time Calculation (min) (ACUTE ONLY): 47 min  Past Medical History:  Past Medical History:  Diagnosis Date   Adenomatous colon polyp    4/06 FS--[facility]; 9/07 colon: [facility] sm aden p   ASCVD (arteriosclerotic cardiovascular disease)    single vessel, s/p DE stent to mid LAD 4/06   Essential hypertension, benign    Fibromyalgia    questionable   Former smoker, stopped smoking in distant past    GERD (gastroesophageal reflux disease)    UGI 09/2012   Gout    H/O heart artery stent    Hyperlipidemia    Macular hole    at [facility]   Obesity    Prediabetes    S/P gastric sleeve procedure    Statin intolerance    Past Surgical History:  Past Surgical History:  Procedure Laterality Date   BILIARY STENT PLACEMENT N/A 04/29/2022   Procedure: BILIARY STENT PLACEMENT;  Surgeon: Clarene Essex, MD;  Location: [facility];  Service: Gastroenterology;  Laterality: N/A;   CHOLECYSTECTOMY N/A 04/05/2022   Procedure: LAPAROSCOPIC CHOLECYSTECTOMY;  Surgeon: Dwan Bolt, MD;  Location: [place];  Service: General;  Laterality: N/A;   CORONARY STENT PLACEMENT     taxus stent placed-Dr. Ilda Foil LAS 01/2005   ENDOSCOPIC RETROGRADE CHOLANGIOPANCREATOGRAPHY (ERCP) WITH PROPOFOL N/A 04/29/2022   Procedure: ENDOSCOPIC RETROGRADE CHOLANGIOPANCREATOGRAPHY (ERCP) WITH PROPOFOL;  Surgeon: Clarene Essex, MD;  Location: [facility];  Service: Gastroenterology;  Laterality: N/A;   REMOVAL OF STONES  04/29/2022   Procedure: Balloon Sweep of CBD;  Surgeon: Clarene Essex, MD;  Location: [facility];  Service: Gastroenterology;;   Joan Mayans  04/29/2022   Procedure: Joan Mayans;  Surgeon: Clarene Essex, MD;  Location: [facility];  Service: Gastroenterology;;   STOMACH SURGERY     bariatric  surgery 05/19/13   TONSILLECTOMY     HPI:  Per MD notes "Arthur Shaw is a 76 y.o. male with a history of essential HTN, hyperlipidemia, GERD, prior cardiac stent placement, prior gastric sleeve procedure, fibromyalgia, and CAD.  Presenting today with postoperative concern.  On 04/05/2022 underwent cholecystectomy, anesthetic complications on and off since then.  Also underwent recent ERCP due to continuous bile leak.  Per patient's spouse, gallbladder was necrotic at the time of removal, was unable to place a biliary stent and had to attempt suturing nearby tissue, which contributed to the biliary leak.  Has had a JP drain in place up until 05/25/2022 which it was removed in clinic.  Over the last week, patient has noticed increasing redness, warmth, and significant pain of the area.  Also accompanied with occasional sharp pains near the recent JP drain site that leave him breathless.  Denies fever, chills, N/V, recent changes in BM, bloody stools, urinary symptoms, or active drainage from the wound site."  Surgery is monitoring pt's abdomen and pt is receiving ABX.  Urinary issues reported and Md gave pt flomax - referred to urology.  Pt has h/o sliding hiatal hernia and normal oropharyngeal swallow on prior dg esophagram 08/23/21. Marland Kitchen   Pt is for endoscopy with GI end of September per orders.    Assessment / Plan / Recommendation  Clinical Impression  Patient alert, sitting upright in chair. Reports issues with swallowing pills and food - states lodges in proximal pharynx.  He passed 3 ounce [employer]  water screen easily.  He does appears mildly hoarse and subtle asymmetry of palate on left compared to right noted.  Endorses sensation of retention of food mixed with secretons in proximal phayrnx ? vallecular area.  Cued expectoration resulted in expectoration of secretions mixed with what appeared to be food consumed.   Pt reports issues with secretions -  SLP can not rule out subtle pharyngeal issues - he may  benefit from MBS while in hospital to assure no pharyngeal deficits present.  Messaged surgery advanced practitioner for guidance. Pt willing to do evaluation in xray if indicated.   Recommend continue diet with precautions and medications crushed as indicated. Reviewed prior esophagram with pt - images. SLP Visit Diagnosis: Dysphagia, unspecified (R13.10)    Aspiration Risk  Mild aspiration risk    Diet Recommendation Thin liquid;Regular   Liquid Administration via: Cup;Straw Medication Administration: Other (Comment) (crushed) Supervision: Patient able to self feed Compensations: Slow rate;Small sips/bites Postural Changes: Seated upright at 90 degrees;Remain upright for at least 30 minutes after po intake    Other  Recommendations Oral Care Recommendations: Oral care BID    Recommendations for follow up therapy are one component of a multi-disciplinary discharge planning process, led by the attending physician.  Recommendations may be updated based on patient status, additional functional criteria and insurance authorization.  Follow up Recommendations Follow physician's recommendations for discharge plan and follow up therapies      Assistance Recommended at Discharge Frequent or constant Supervision/Assistance  Functional Status Assessment Patient has had a recent decline in their functional status and/or demonstrates limited ability to make significant improvements in function in a reasonable and predictable amount of time  Frequency and Duration min 1 x/week  1 week       Prognosis Prognosis for Safe Diet Advancement: Fair      Swallow Study   General HPI: Per MD notes "Arthur Shaw is a 76 y.o. male with a history of essential HTN, hyperlipidemia, GERD, prior cardiac stent placement, prior gastric sleeve procedure, fibromyalgia, and CAD.  Presenting today with postoperative concern.  On 04/05/2022 underwent cholecystectomy, anesthetic complications on and off since then.   Also underwent recent ERCP due to continuous bile leak.  Per patient's spouse, gallbladder was necrotic at the time of removal, was unable to place a biliary stent and had to attempt suturing nearby tissue, which contributed to the biliary leak.  Has had a JP drain in place up until 05/25/2022 which it was removed in clinic.  Over the last week, patient has noticed increasing redness, warmth, and significant pain of the area.  Also accompanied with occasional sharp pains near the recent JP drain site that leave him breathless.  Denies fever, chills, N/V, recent changes in BM, bloody stools, urinary symptoms, or active drainage from the wound site."  Surgery is monitoring pt's abdomen and pt is receiving ABX.  Urinary issues reported and Md gave pt flomax - referred to urology.  Pt has h/o sliding hiatal hernia and normal oropharyngeal swallow on prior dg esophagram 08/23/21. . Type of Study: Bedside Swallow Evaluation Previous Swallow Assessment: see hpi Diet Prior to this Study: Regular;Thin liquids Temperature Spikes Noted: No Respiratory Status: Room air History of Recent Intubation: No Behavior/Cognition: Alert;Cooperative;Pleasant mood Oral Cavity Assessment: Within Functional Limits Oral Care Completed by SLP: No Vision: Functional for self-feeding Self-Feeding Abilities: Able to feed self Patient Positioning: Upright in bed Baseline Vocal Quality: Other (comment) (mildly hoarse) Volitional Cough: Strong Volitional Swallow: Able to elicit  Oral/Motor/Sensory Function Overall Oral Motor/Sensory Function: Other (comment) (pt appears with assymmetry of left side of palate vs right, appears lower than his right)   Ice Chips Ice chips: Not tested   Thin Liquid Presentation: Cup;Self Fed;Straw Pharyngeal  Phase Impairments: Throat Clearing - Immediate Other Comments: throat clear x1 noted when consuming soda, pt passed 3 ounce [employer] swallow screen easily    Nectar Thick Nectar Thick Liquid:  Not tested   Honey Thick Honey Thick Liquid: Not tested   Puree Puree: Impaired Presentation: Spoon;Self Fed Pharyngeal Phase Impairments: Other (comments) Other Comments: reports sensation to retention in proximal pharynx - cued cough and expectoration cleared minimal amount of secretions that appeared mixed with applesauce   Solid     Solid: Impaired Presentation: Self Fed Pharyngeal Phase Impairments: Other (comments) Other Comments: pt reports sensation of retention in phayrnx - cued expectoration revealed secretions, ? mixed with cracker -      Arthur Shaw 06/08/2022,1:08 PM   Arthur Lime, MS [place] Office [phone] Pager [phone]

## 2022-06-09 LAB — BASIC METABOLIC PANEL
Anion gap: 10 (ref 5–15)
BUN: 5 mg/dL — ABNORMAL LOW (ref 8–23)
CO2: 22 mmol/L (ref 22–32)
Calcium: 8.4 mg/dL — ABNORMAL LOW (ref 8.9–10.3)
Chloride: 106 mmol/L (ref 98–111)
Creatinine, Ser: 0.53 mg/dL — ABNORMAL LOW (ref 0.61–1.24)
GFR, Estimated: >60 mL/min (ref >60)
Glucose, Bld: 107 mg/dL — ABNORMAL HIGH (ref 70–99)
Potassium: 3.3 mmol/L — ABNORMAL LOW (ref 3.5–5.1)
Sodium: 138 mmol/L (ref 135–145)

## 2022-06-09 MED ORDER — AMOXICILLIN-POT CLAVULANATE 875-125 MG PO TABS: 1.0000 | ORAL_TABLET | Freq: Two times a day (BID) | ORAL | 0 refills | Status: AC

## 2022-06-09 NOTE — Discharge Summary (Signed)
Patient ID: Arthur Shaw [id number] 01/22/1946 76 y.o.  Admit date: 06/05/2022 Discharge date: 06/09/2022  Admitting Diagnosis: Abdominal wall abscess, s/p fenestrated lap chole with JP drain placement, bile leak and stent placement  Discharge Diagnosis Patient Active Problem List   Diagnosis Date Noted   S/P laparoscopic cholecystectomy 06/07/2022   Abdominal wall abscess 06/05/2022   Acute cholecystitis due to biliary calculus 04/04/2022   Allergic conjunctivitis and rhinitis 06/13/2015   Eustachian tube dysfunction 06/13/2015   Elevated blood pressure 02/06/2015   Stress disorder, acute 02/06/2015   Obesity 05/02/2014   Coronary atherosclerosis of native coronary artery 04/24/2014   Hypertension    Essential hypertension, benign    Hyperlipidemia     Consultants Dr. Tresa Moore with urology  Reason for Admission: The patient is a 76 year old white male who is about 2 months status post laparoscopic cholecystectomy.  His postoperative course was complicated by a bile leak requiring a percutaneous drain.  The drain was in for quite some time but was removed about 2 weeks ago.  Over the last couple days he has had increased swelling and pain along the right flank where the drain came out.  He denies any fevers or chills.  He denies any nausea or vomiting.  He came to the emergency department where a CT scan showed a developing phlegmon in the right lateral abdominal wall and a small 2 x 3 cm fluid collection in the gallbladder fossa.  Procedures none  Hospital Course:  The patient was admitted and placed on zosyn.  His abdominal wall abscess began to spontaneously drain and with a combo of the drainage and abx therapy, his cellulitis resolved.  No evidence of bilious drainage was noted from this site, so suspicion for continued bile leak is low.  His diet was able to be advanced as tolerated.  He did have urinary retention and a foley placed.  His medications for this were adjusted  by urology.  He will follow up in 2 weeks for a voiding trial.  He otherwise improved and was stable for DC home on HD 4.  Physical Exam: Gen: NAD Lungs: respiratory effort nonlabored Abd: old drain site with minimal purulent drainage present.  No bile leakage through this site.  Erythema has resolved.  Nontender in his abodmen.  Nondistended.  Allergies as of 06/09/2022       Reactions   Morphine    Pt was not himself, wife states he was "Bonkers"   Statins    memory fatigue   Welchol [colesevelam Hcl]    Muscle aches        Medication List     TAKE these medications    acetaminophen-codeine 300-30 MG tablet Commonly known as: TYLENOL #3 Take 1 tablet by mouth every 8 (eight) hours as needed for moderate pain.   amLODipine 5 MG tablet Commonly known as: NORVASC Take 5 mg by mouth at bedtime.   amoxicillin-clavulanate 875-125 MG tablet Commonly known as: AUGMENTIN Take 1 tablet by mouth 2 (two) times daily for 3 days.   benazepril 40 MG tablet Commonly known as: LOTENSIN Take 40 mg by mouth daily.   cetirizine 10 MG tablet Commonly known as: ZYRTEC Take 10 mg by mouth daily as needed for allergies.   clopidogrel 75 MG tablet Commonly known as: PLAVIX Take 75 mg by mouth daily.   cyclobenzaprine 10 MG tablet Commonly known as: FLEXERIL Take 10 mg by mouth 3 (three) times daily as needed for muscle spasms.  eszopiclone 2 MG Tabs tablet Commonly known as: LUNESTA Take 2 mg by mouth at bedtime.   ezetimibe 10 MG tablet Commonly known as: ZETIA Take 10 mg by mouth at bedtime.   finasteride 5 MG tablet Commonly known as: PROSCAR Take 1 tablet (5 mg total) by mouth daily. For prostate / urinary retention.   fluticasone 50 MCG/ACT nasal spray Commonly known as: FLONASE Place 1 spray into both nostrils daily.   guaiFENesin-codeine 100-10 MG/5ML syrup Commonly known as: ROBITUSSIN AC Take 4 mLs by mouth every 8 (eight) hours as needed for cough.    lansoprazole 30 MG capsule Commonly known as: PREVACID Take 30 mg by mouth every evening. Taken before dinner and opens it   MELATONIN PO Take 10 mg by mouth at bedtime.   metoprolol tartrate 50 MG tablet Commonly known as: LOPRESSOR Take 75 mg by mouth 2 (two) times daily.   montelukast 10 MG tablet Commonly known as: SINGULAIR Take 10 mg by mouth at bedtime.   rosuvastatin 10 MG tablet Commonly known as: Crestor Take 1 tablet (10 mg total) by mouth daily. What changed: when to take this   silodosin 8 MG Caps capsule Commonly known as: RAPAFLO Take 1 capsule (8 mg total) by mouth daily with breakfast. (Cap can be opened and sprinkled on food / applesauce)   Stool Softener 100 MG capsule Generic drug: docusate sodium Take 100 mg by mouth daily as needed for mild constipation.   triamcinolone lotion 0.1 % Commonly known as: KENALOG Apply 1 Application topically daily as needed for rash.   TYLENOL EXTRA STRENGTH PO Take 500 mg by mouth every 8 (eight) hours as needed (pain). Liquid Form   Vitamin D3 125 MCG (5000 UT) Caps Take 5,000 Units by mouth daily.          Follow-up Information     [facility] Follow up.   Why: Office will call to schedule apointment for trial of catheter removal. Contact information: [place]        Dwan Bolt, MD Follow up on 06/20/2022.   Specialty: General Surgery Why: 3:00pm, arrive by 2:45pm for check in Contact information: [address] [place] [address] [phone]                 Signed: Saverio Danker, [facility] 06/09/2022, 11:13 AM Please see Amion for pager number during day hours 7:00am-4:30pm, 7-11:30am on Weekends

## 2022-06-09 NOTE — Progress Notes (Signed)
Mobility Specialist Cancellation/Refusal Note:   Reason for Cancellation/Refusal: Pt declined mobility at this time. Pt walks w/ wife. Will check back as schedule permits.      [facility]

## 2022-06-09 NOTE — Discharge Instructions (Signed)
Dry dressing to abdominal wall site and change as needed.  May shower, do not submerge in water.

## 2022-06-09 NOTE — Evaluation (Signed)
SLP Cancellation Note  Patient Details Name: Arthur Shaw MRN: [id number] DOB: 04/13/1946   Cancelled treatment:       Reason Eval/Treat Not Completed: Other (comment) (SLP went to room to assue appropriate for MBS today. Wife, [name], present and advised that she does not want the test done.  She advised the problem has been resolved as swallow concern was around a specific pill that she understood could not be crushed.) [name] reports she received approval to crush the medicine in question. Advised MBS could be done as an OP if needed.  Will sign off.   Kathleen Lime, MS [facility] SLP Acute Rehab Services Office [phone] Pager [phone]   Macario Golds 06/09/2022, 7:54 AM

## 2022-06-14 DIAGNOSIS — Z79899 Other long term (current) drug therapy: Secondary | ICD-10-CM | POA: Diagnosis not present

## 2022-06-14 DIAGNOSIS — I10 Essential (primary) hypertension: Secondary | ICD-10-CM | POA: Diagnosis not present

## 2022-06-14 DIAGNOSIS — Z23 Encounter for immunization: Secondary | ICD-10-CM | POA: Diagnosis not present

## 2022-06-14 DIAGNOSIS — N401 Enlarged prostate with lower urinary tract symptoms: Secondary | ICD-10-CM | POA: Diagnosis not present

## 2022-06-14 DIAGNOSIS — K219 Gastro-esophageal reflux disease without esophagitis: Secondary | ICD-10-CM | POA: Diagnosis not present

## 2022-06-14 DIAGNOSIS — L02211 Cutaneous abscess of abdominal wall: Secondary | ICD-10-CM | POA: Diagnosis not present

## 2022-06-14 DIAGNOSIS — K838 Other specified diseases of biliary tract: Secondary | ICD-10-CM | POA: Diagnosis not present

## 2022-06-21 ENCOUNTER — Encounter (HOSPITAL_COMMUNITY): Payer: Self-pay | Admitting: Gastroenterology

## 2022-06-21 ENCOUNTER — Other Ambulatory Visit: Payer: Self-pay

## 2022-06-24 DIAGNOSIS — N401 Enlarged prostate with lower urinary tract symptoms: Secondary | ICD-10-CM | POA: Diagnosis not present

## 2022-06-24 DIAGNOSIS — R3914 Feeling of incomplete bladder emptying: Secondary | ICD-10-CM | POA: Diagnosis not present

## 2022-06-28 ENCOUNTER — Ambulatory Visit (HOSPITAL_COMMUNITY): Payer: PPO | Admitting: Physician Assistant

## 2022-06-28 ENCOUNTER — Encounter (HOSPITAL_COMMUNITY): Payer: Self-pay | Admitting: Gastroenterology

## 2022-06-28 ENCOUNTER — Other Ambulatory Visit: Payer: Self-pay

## 2022-06-28 ENCOUNTER — Encounter (HOSPITAL_COMMUNITY)
Admission: RE | Disposition: A | Discharge status: Home / Self Care | Payer: Self-pay | Source: Home / Self Care | Attending: Gastroenterology

## 2022-06-28 ENCOUNTER — Ambulatory Visit (HOSPITAL_COMMUNITY)
Admission: RE | Admit: 2022-06-28 | Discharge: 2022-06-28 | Disposition: A | Discharge status: Home / Self Care | Payer: PPO | Attending: Gastroenterology | Admitting: Gastroenterology

## 2022-06-28 ENCOUNTER — Ambulatory Visit (HOSPITAL_BASED_OUTPATIENT_CLINIC_OR_DEPARTMENT_OTHER): Payer: PPO | Admitting: Physician Assistant

## 2022-06-28 DIAGNOSIS — I2510 Atherosclerotic heart disease of native coronary artery without angina pectoris: Secondary | ICD-10-CM | POA: Insufficient documentation

## 2022-06-28 DIAGNOSIS — K219 Gastro-esophageal reflux disease without esophagitis: Secondary | ICD-10-CM | POA: Insufficient documentation

## 2022-06-28 DIAGNOSIS — Z87891 Personal history of nicotine dependence: Secondary | ICD-10-CM | POA: Insufficient documentation

## 2022-06-28 DIAGNOSIS — I10 Essential (primary) hypertension: Secondary | ICD-10-CM | POA: Diagnosis not present

## 2022-06-28 DIAGNOSIS — Z7902 Long term (current) use of antithrombotics/antiplatelets: Secondary | ICD-10-CM | POA: Insufficient documentation

## 2022-06-28 DIAGNOSIS — Z79899 Other long term (current) drug therapy: Secondary | ICD-10-CM | POA: Insufficient documentation

## 2022-06-28 DIAGNOSIS — Z4659 Encounter for fitting and adjustment of other gastrointestinal appliance and device: Secondary | ICD-10-CM | POA: Diagnosis not present

## 2022-06-28 DIAGNOSIS — Z955 Presence of coronary angioplasty implant and graft: Secondary | ICD-10-CM | POA: Insufficient documentation

## 2022-06-28 DIAGNOSIS — K449 Diaphragmatic hernia without obstruction or gangrene: Secondary | ICD-10-CM | POA: Insufficient documentation

## 2022-06-28 DIAGNOSIS — K2940 Chronic atrophic gastritis without bleeding: Secondary | ICD-10-CM | POA: Insufficient documentation

## 2022-06-28 DIAGNOSIS — F419 Anxiety disorder, unspecified: Secondary | ICD-10-CM | POA: Insufficient documentation

## 2022-06-28 DIAGNOSIS — Z4689 Encounter for fitting and adjustment of other specified devices: Secondary | ICD-10-CM | POA: Insufficient documentation

## 2022-06-28 HISTORY — PX: STENT REMOVAL: SHX6421

## 2022-06-28 HISTORY — PX: ESOPHAGOGASTRODUODENOSCOPY (EGD) WITH PROPOFOL: SHX5813

## 2022-06-28 HISTORY — DX: Atherosclerotic heart disease of native coronary artery without angina pectoris: I25.10

## 2022-06-28 HISTORY — DX: Personal history of other diseases of the digestive system: Z87.19

## 2022-06-28 HISTORY — DX: Prediabetes: R73.03

## 2022-06-28 LAB — POCT I-STAT, CHEM 8
BUN: 10 mg/dL (ref 8–23)
Calcium, Ion: 1.04 mmol/L — ABNORMAL LOW (ref 1.15–1.40)
Chloride: 102 mmol/L (ref 98–111)
Creatinine, Ser: 0.6 mg/dL — ABNORMAL LOW (ref 0.61–1.24)
Glucose, Bld: 104 mg/dL — ABNORMAL HIGH (ref 70–99)
HCT: 39 % (ref 39.0–52.0)
Hemoglobin: 13.3 g/dL (ref 13.0–17.0)
Potassium: 5 mmol/L (ref 3.5–5.1)
Sodium: 135 mmol/L (ref 135–145)
TCO2: 25 mmol/L (ref 22–32)

## 2022-06-28 SURGERY — ESOPHAGOGASTRODUODENOSCOPY (EGD) WITH PROPOFOL
Anesthesia: Monitor Anesthesia Care

## 2022-06-28 MED ORDER — LIDOCAINE 2% (20 MG/ML) 5 ML SYRINGE
INTRAMUSCULAR | Status: DC | PRN
  Administered 2022-06-28: 100 mg via INTRAVENOUS

## 2022-06-28 MED ORDER — PROPOFOL 500 MG/50ML IV EMUL
INTRAVENOUS | Status: DC | PRN
  Administered 2022-06-28: 120 ug/kg/min via INTRAVENOUS

## 2022-06-28 MED ORDER — SODIUM CHLORIDE 0.9 % IV SOLN: INTRAVENOUS | Status: DC

## 2022-06-28 MED ORDER — LACTATED RINGERS IV SOLN: INTRAVENOUS | Status: DC

## 2022-06-28 MED ORDER — PROPOFOL 10 MG/ML IV BOLUS
INTRAVENOUS | Status: DC | PRN
  Administered 2022-06-28 (×3): 20 mg via INTRAVENOUS

## 2022-06-28 SURGICAL SUPPLY — 15 items

## 2022-06-28 NOTE — Anesthesia Postprocedure Evaluation (Signed)
Anesthesia Post Note  Patient: Arthur Shaw  Procedure(s) Performed: ESOPHAGOGASTRODUODENOSCOPY (EGD) WITH PROPOFOL STENT REMOVAL     Patient location during evaluation: Endoscopy Anesthesia Type: MAC Level of consciousness: awake and alert Pain management: pain level controlled Vital Signs Assessment: post-procedure vital signs reviewed and stable Respiratory status: spontaneous breathing, nonlabored ventilation and respiratory function stable Cardiovascular status: stable and blood pressure returned to baseline Postop Assessment: no apparent nausea or vomiting Anesthetic complications: no   No notable events documented.  Last Vitals:  Vitals:   06/28/22 1130 06/28/22 1134  BP: (!) 144/75   Pulse: (!) 55 (!) 56  Resp: 15 14  Temp:    SpO2: 98% 99%    Last Pain:  Vitals:   06/28/22 1010  TempSrc: Oral  PainSc: 0-No pain                 Marcianna Daily,W. EDMOND

## 2022-06-28 NOTE — Op Note (Signed)
[facility] Patient Name: Arthur Shaw Procedure Date: 06/28/2022 MRN: [id number] Attending MD: Clarene Essex , MD Date of Birth: [birth year] CSN: [id number] Age: 76 Admit Type: Outpatient Procedure:                Upper GI endoscopy Indications:              Biliary stent removal Providers:                Clarene Essex, MD, Jaci Carrel, RN, Frazier Richards,                            Technician Referring MD:              Medicines:                Monitored Anesthesia Care Complications:            No immediate complications. Estimated Blood Loss:     Estimated blood loss: none. Procedure:                Pre-Anesthesia Assessment:                           - Prior to the procedure, a History and Physical                            was performed, and patient medications and                            allergies were reviewed. The patient's tolerance of                            previous anesthesia was also reviewed. The risks                            and benefits of the procedure and the sedation                            options and risks were discussed with the patient.                            All questions were answered, and informed consent                            was obtained. Prior Anticoagulants: The patient has                            taken Plavix (clopidogrel), last dose was 2 days                            prior to procedure. ASA Grade Assessment: III - A                            patient with severe systemic disease. After  reviewing the risks and benefits, the patient was                            deemed in satisfactory condition to undergo the                            procedure.                           After obtaining informed consent, the endoscope was                            passed under direct vision. Throughout the                            procedure, the patient's blood pressure, pulse, and                             oxygen saturations were monitored continuously. The                            [id number]-[id number] ([id number]) Olympus endoscope was introduced                            through the mouth, and advanced to the third part                            of duodenum. The upper GI endoscopy was                            accomplished without difficulty. The patient                            tolerated the procedure well. Scope In: Scope Out: Findings:      The larynx was normal.      A small hiatal hernia was present.      Diffuse mild inflammation characterized by congestion (edema) was found       in the entire examined stomach.      A previously placed plastic biliary stent was seen at the major papilla.       Stent removal was accomplished with a snare. And retrieved with       withdrawing the scope and the snare and the stent together and the scope       was reinserted and the endoscopy was repeated and there was no sign of       other abnormality or bleeding      The ampulla, duodenal bulb, first portion of the duodenum, second       portion of the duodenum, major papilla and third portion of the duodenum       were normal.      The exam was otherwise without abnormality. Impression:               - Normal larynx.                           - Small hiatal  hernia.                           - Chronic atrophic gastritis.                           - Plastic biliary stent in the duodenum. Removed.                           - Normal ampulla, duodenal bulb, first portion of                            the duodenum, second portion of the duodenum, major                            papilla and third portion of the duodenum.                           - The examination was otherwise normal. Moderate Sedation:      Not Applicable - Patient had care per Anesthesia. Recommendation:           - Patient has a contact number available for                            emergencies. The signs and symptoms of  potential                            delayed complications were discussed with the                            patient. Return to normal activities tomorrow.                            Written discharge instructions were provided to the                            patient.                           - Soft diet today.                           - Continue present medications.                           - Resume Plavix (clopidogrel) at prior dose                            tomorrow.                           - Return to GI clinic PRN.                           - Telephone GI clinic if symptomatic PRN. Procedure Code(s):        --- Professional ---  [phone], Esophagogastroduodenoscopy, flexible,                            transoral; with removal of foreign body(s) Diagnosis Code(s):        --- Professional ---                           K44.9, Diaphragmatic hernia without obstruction or                            gangrene                           K29.40, Chronic atrophic gastritis without bleeding                           Z46.59, Encounter for fitting and adjustment of                            other gastrointestinal appliance and device CPT copyright 2019 [employer]. All rights reserved. The codes documented in this report are preliminary and upon coder review may  be revised to meet current compliance requirements. Clarene Essex, MD 06/28/2022 11:17:22 AM This report has been signed electronically. Number of Addenda: 0

## 2022-06-28 NOTE — Discharge Instructions (Addendum)
Call if GI question or problem and follow-up as needed from a GI standpoint otherwise begin with soft solid diet and if okay may slowly advance and if doing okay tomorrow may resume Plavix tomorrowYOU HAD AN ENDOSCOPIC PROCEDURE TODAY: Refer to the procedure report and other information in the discharge instructions given to you for any specific questions about what was found during the examination. If this information does not answer your questions, please call [facility] office at [phone] to clarify.   YOU SHOULD EXPECT: Some feelings of bloating in the abdomen. Passage of more gas than usual. Walking can help get rid of the air that was put into your GI tract during the procedure and reduce the bloating. If you had a lower endoscopy (such as a colonoscopy or flexible sigmoidoscopy) you may notice spotting of blood in your stool or on the toilet paper. Some abdominal soreness may be present for a day or two, also.  DIET: Your first meal following the procedure should be a light meal and then it is ok to progress to your normal diet. A half-sandwich or bowl of soup is an example of a good first meal. Heavy or fried foods are harder to digest and may make you feel nauseous or bloated. Drink plenty of fluids but you should avoid alcoholic beverages for 24 hours. If you had a esophageal dilation, please see attached instructions for diet.    ACTIVITY: Your care partner should take you home directly after the procedure. You should plan to take it easy, moving slowly for the rest of the day. You can resume normal activity the day after the procedure however YOU SHOULD NOT DRIVE, use power tools, machinery or perform tasks that involve climbing or major physical exertion for 24 hours (because of the sedation medicines used during the test).   SYMPTOMS TO REPORT IMMEDIATELY: A gastroenterologist can be reached at any hour. Please call [phone]  for any of the following symptoms:  Following lower endoscopy  (colonoscopy, flexible sigmoidoscopy) Excessive amounts of blood in the stool  Significant tenderness, worsening of abdominal pains  Swelling of the abdomen that is new, acute  Fever of 100 or higher  Following upper endoscopy (EGD, EUS, ERCP, esophageal dilation) Vomiting of blood or coffee ground material  New, significant abdominal pain  New, significant chest pain or pain under the shoulder blades  Painful or persistently difficult swallowing  New shortness of breath  Black, tarry-looking or red, bloody stools  FOLLOW UP:  If any biopsies were taken you will be contacted by phone or by letter within the next 1-3 weeks. Call [phone]  if you have not heard about the biopsies in 3 weeks.  Please also call with any specific questions about appointments or follow up tests. YOU HAD AN ENDOSCOPIC PROCEDURE TODAY: Refer to the procedure report and other information in the discharge instructions given to you for any specific questions about what was found during the examination. If this information does not answer your questions, please call [facility] office at [phone] to clarify.   YOU SHOULD EXPECT: Some feelings of bloating in the abdomen. Passage of more gas than usual. Walking can help get rid of the air that was put into your GI tract during the procedure and reduce the bloating. If you had a lower endoscopy (such as a colonoscopy or flexible sigmoidoscopy) you may notice spotting of blood in your stool or on the toilet paper. Some abdominal soreness may be present for a day or  two, also.  DIET: Your first meal following the procedure should be a light meal and then it is ok to progress to your normal diet. A half-sandwich or bowl of soup is an example of a good first meal. Heavy or fried foods are harder to digest and may make you feel nauseous or bloated. Drink plenty of fluids but you should avoid alcoholic beverages for 24 hours. If you had a esophageal dilation, please see attached  instructions for diet.    ACTIVITY: Your care partner should take you home directly after the procedure. You should plan to take it easy, moving slowly for the rest of the day. You can resume normal activity the day after the procedure however YOU SHOULD NOT DRIVE, use power tools, machinery or perform tasks that involve climbing or major physical exertion for 24 hours (because of the sedation medicines used during the test).   SYMPTOMS TO REPORT IMMEDIATELY: A gastroenterologist can be reached at any hour. Please call [phone]  for any of the following symptoms:  Following lower endoscopy (colonoscopy, flexible sigmoidoscopy) Excessive amounts of blood in the stool  Significant tenderness, worsening of abdominal pains  Swelling of the abdomen that is new, acute  Fever of 100 or higher  Following upper endoscopy (EGD, EUS, ERCP, esophageal dilation) Vomiting of blood or coffee ground material  New, significant abdominal pain  New, significant chest pain or pain under the shoulder blades  Painful or persistently difficult swallowing  New shortness of breath  Black, tarry-looking or red, bloody stools  FOLLOW UP:  If any biopsies were taken you will be contacted by phone or by letter within the next 1-3 weeks. Call [phone]  if you have not heard about the biopsies in 3 weeks.  Please also call with any specific questions about appointments or follow up tests.

## 2022-06-28 NOTE — Anesthesia Preprocedure Evaluation (Addendum)
Anesthesia Evaluation  Patient identified by MRN, date of birth, ID band Patient awake    Reviewed: Allergy & Precautions, H&P , NPO status , Patient's Chart, lab work & pertinent test results, reviewed documented beta blocker date and time   Airway Mallampati: II  TM Distance: >3 FB Neck ROM: Full    Dental no notable dental hx. (+) Partial Lower, Partial Upper, Dental Advisory Given   Pulmonary neg pulmonary ROS, former smoker,    Pulmonary exam normal breath sounds clear to auscultation       Cardiovascular hypertension, Pt. on medications and Pt. on home beta blockers + CAD and + Cardiac Stents   Rhythm:Regular Rate:Normal     Neuro/Psych Anxiety negative neurological ROS     GI/Hepatic Neg liver ROS, hiatal hernia, GERD  Medicated,  Endo/Other  negative endocrine ROS  Renal/GU negative Renal ROS  negative genitourinary   Musculoskeletal   Abdominal   Peds  Hematology negative hematology ROS (+)   Anesthesia Other Findings   Reproductive/Obstetrics negative OB ROS                            Anesthesia Physical Anesthesia Plan  ASA: 3  Anesthesia Plan: MAC   Post-op Pain Management: Minimal or no pain anticipated   Induction: Intravenous  PONV Risk Score and Plan: 1 and Propofol infusion  Airway Management Planned: Natural Airway and Simple Face Mask  Additional Equipment:   Intra-op Plan:   Post-operative Plan:   Informed Consent: I have reviewed the patients History and Physical, chart, labs and discussed the procedure including the risks, benefits and alternatives for the proposed anesthesia with the patient or authorized representative who has indicated his/her understanding and acceptance.     Dental advisory given  Plan Discussed with: CRNA  Anesthesia Plan Comments:         Anesthesia Quick Evaluation

## 2022-06-28 NOTE — Progress Notes (Signed)
Arthur Shaw 10:48 AM  Subjective: Patient seen and examined and case discussed with his wife he has not had any new medical issues since he was seen recently in our office and he stopped his Plavix 2 days ago and has no other complaints and we answered all of the wife's questions  Objective: Vital signs stable afebrile exam please see preassessment evaluation  Assessment: Bile leak status post stenting  Plan: Okay to proceed with endoscopy and stent removal anesthesia assistance  [facility]  office [phone] After 5PM or if no answer call [phone]

## 2022-06-28 NOTE — Transfer of Care (Signed)
Immediate Anesthesia Transfer of Care Note  Patient: Arthur Shaw  Procedure(s) Performed: ESOPHAGOGASTRODUODENOSCOPY (EGD) WITH PROPOFOL STENT REMOVAL  Patient Location: Endoscopy Unit  Anesthesia Type:MAC  Level of Consciousness: awake  Airway & Oxygen Therapy: Patient Spontanous Breathing and Patient connected to face mask oxygen  Post-op Assessment: Report given to RN and Post -op Vital signs reviewed and stable  Post vital signs: Reviewed and stable  Last Vitals:  Vitals Value Taken Time  BP    Temp    Pulse    Resp    SpO2      Last Pain:  Vitals:   06/28/22 1010  TempSrc: Oral  PainSc: 0-No pain         Complications: No notable events documented.

## 2022-06-29 ENCOUNTER — Encounter (HOSPITAL_COMMUNITY): Payer: Self-pay | Admitting: Gastroenterology

## 2022-07-19 DIAGNOSIS — K838 Other specified diseases of biliary tract: Secondary | ICD-10-CM | POA: Diagnosis not present

## 2022-07-19 DIAGNOSIS — Z9049 Acquired absence of other specified parts of digestive tract: Secondary | ICD-10-CM | POA: Diagnosis not present

## 2022-07-19 DIAGNOSIS — K9189 Other postprocedural complications and disorders of digestive system: Secondary | ICD-10-CM | POA: Diagnosis not present

## 2023-04-11 ENCOUNTER — Other Ambulatory Visit: Payer: Self-pay | Admitting: Internal Medicine

## 2023-04-11 DIAGNOSIS — R413 Other amnesia: Secondary | ICD-10-CM

## 2023-04-25 ENCOUNTER — Other Ambulatory Visit: Payer: PPO

## 2023-05-14 IMAGING — RF DG ESOPHAGUS
7 series · 14 of 24 positions shown · non-contrast
Comparison: 01/27/2005 chest CT.

CLINICAL DATA: Dysphagia, unspecified type. Globus sensation in the
throat with choking and vomiting episodes with solids and pills.

EXAM:
ESOPHOGRAM / BARIUM SWALLOW / BARIUM TABLET STUDY
TECHNIQUE: Combined double contrast and single contrast examination performed
using effervescent crystals, thick barium liquid, and thin barium
liquid. The patient declined to swallow a 13 mm barium sulphate
tablet.
FLUOROSCOPY TIME:  Fluoroscopy Time:  3 minutes 0 seconds
Radiation Exposure Index (if provided by the fluoroscopic device):
32.5 mGy
Number of Acquired Spot Images: 7

[Series 1: sequence · 2 of 22 frames shown (1 of 5)]
[frame 4/22]
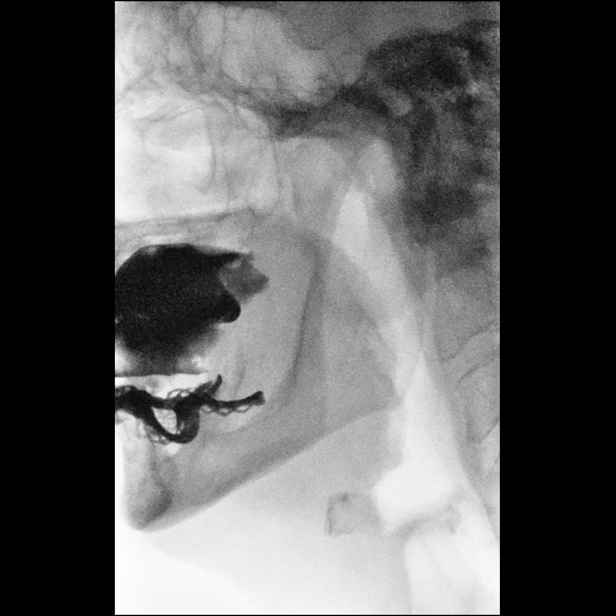
[frame 12/22]
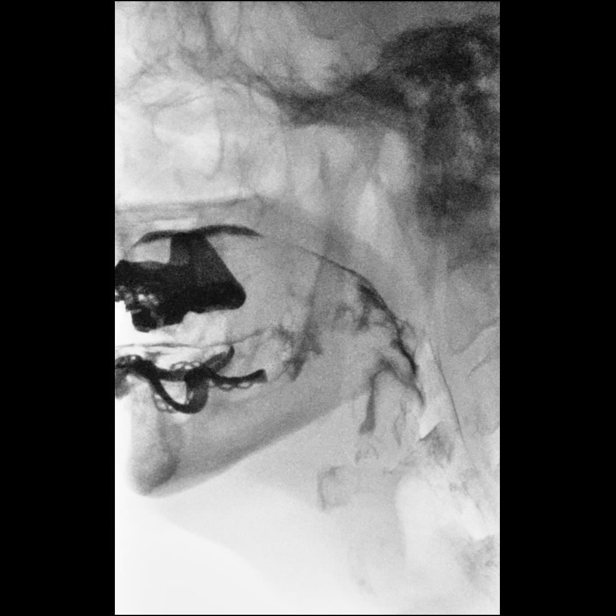

[Series 3: sequence · 3 of 22 frames shown (2 of 5)]
[frame 4/22]
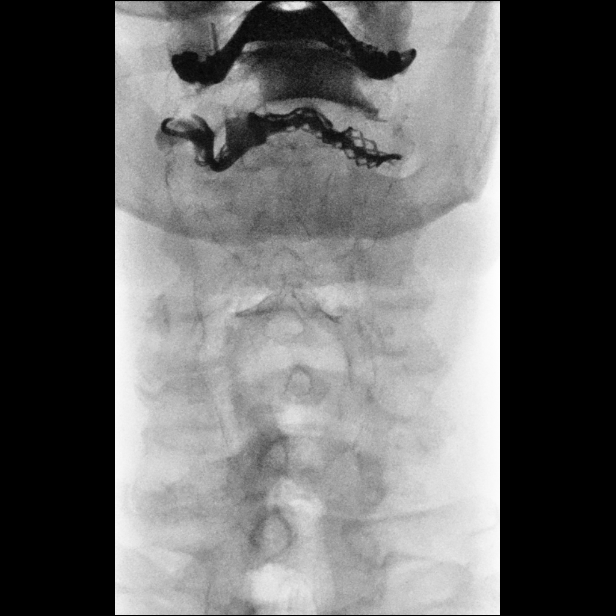
[frame 12/22]
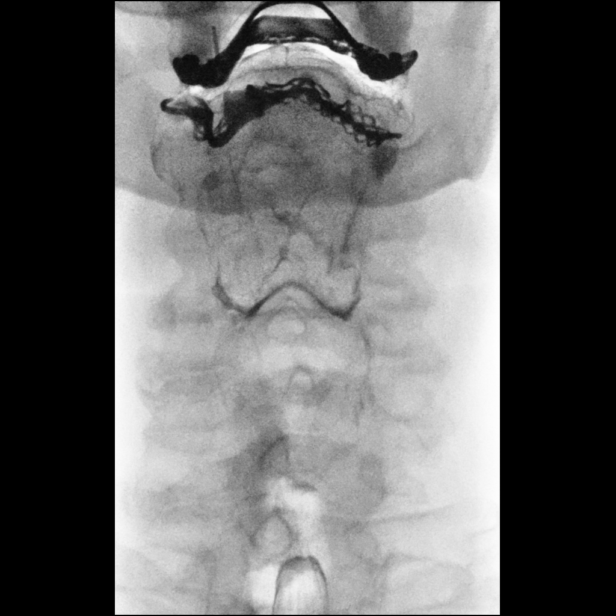
[frame 19/22]
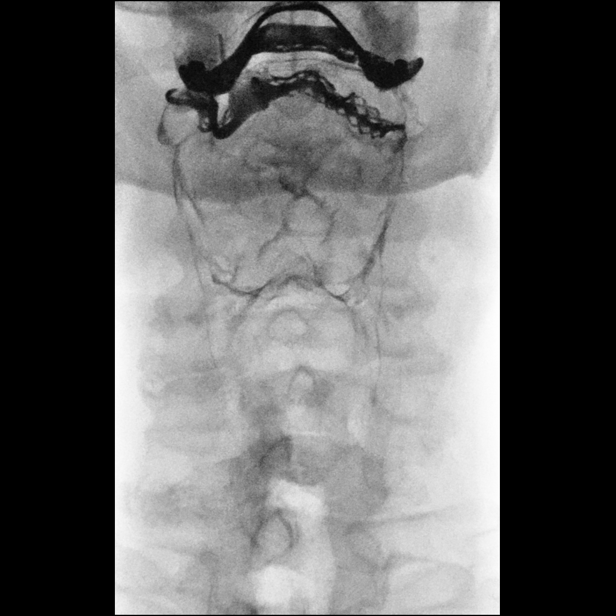

[Series 4: sequence · 2 of 42 frames shown (3 of 5)]
[frame 22/42]
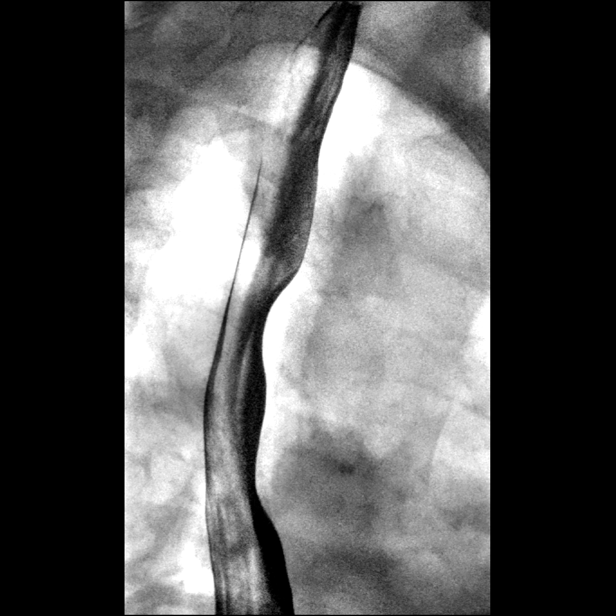
[frame 38/42]
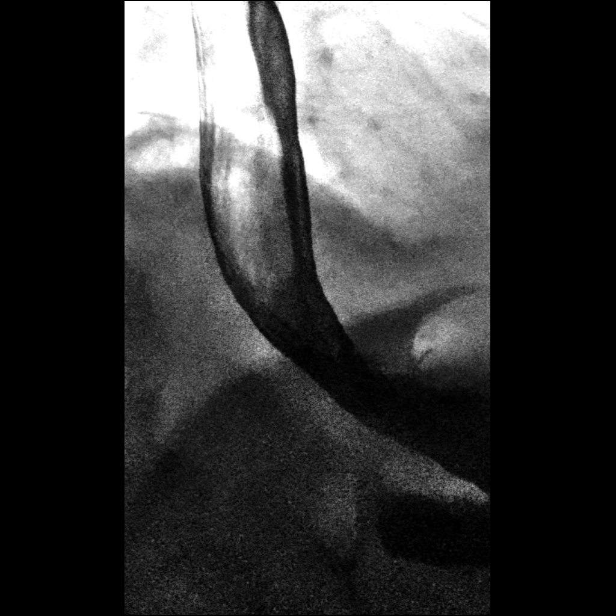

[Series 5: one shot · 0.15mm/px · 2 of 5 slices shown (1 of 2)]
[im 2/5]
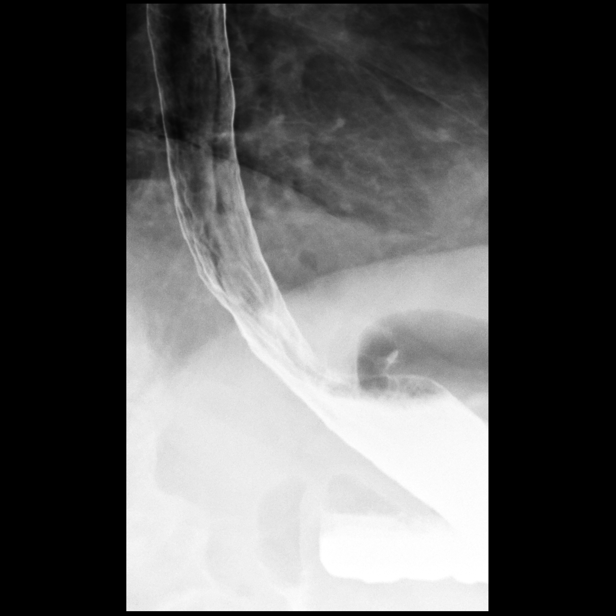
[im 4/5]
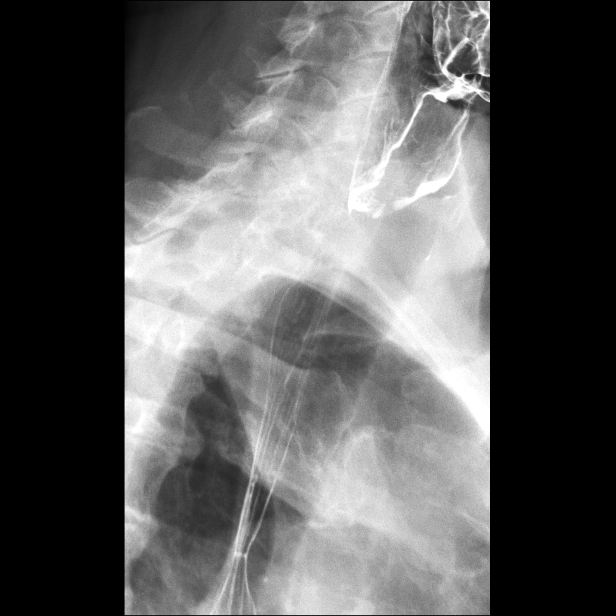

[Series 6: sequence · 3 of 52 frames shown (4 of 5)]
[frame 8/52]
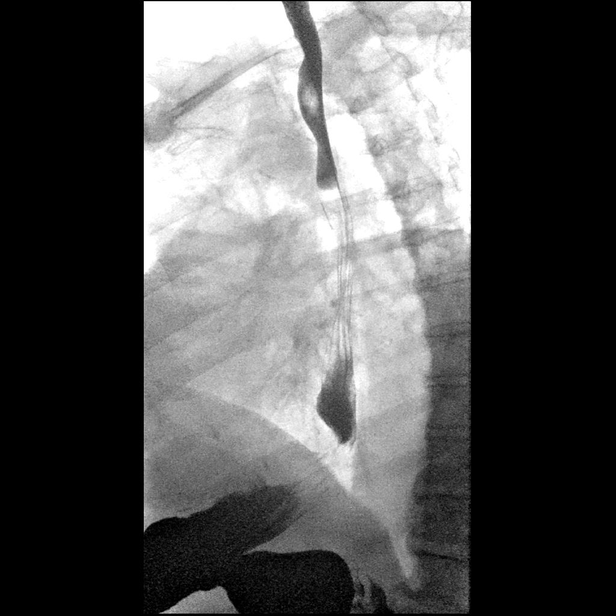
[frame 27/52]
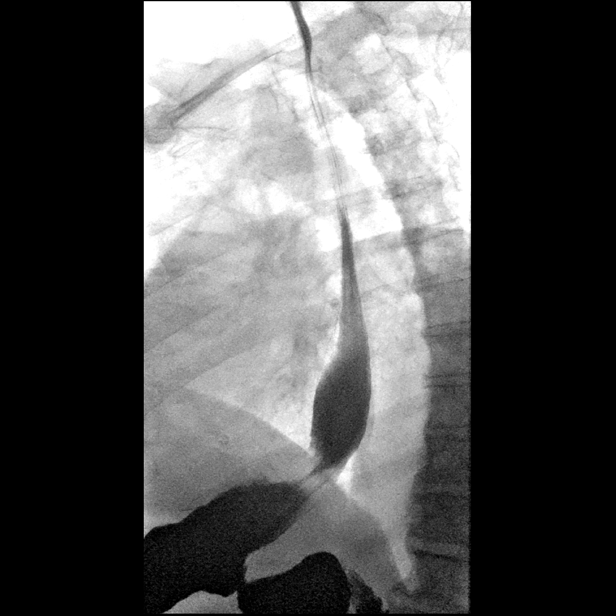
[frame 45/52]
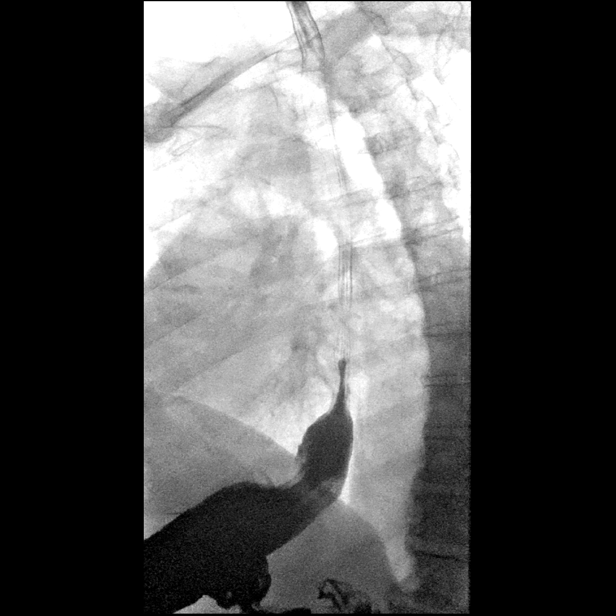

[Series 7: sequence · 1 of 50 frames shown (5 of 5)]
[frame 26/50]
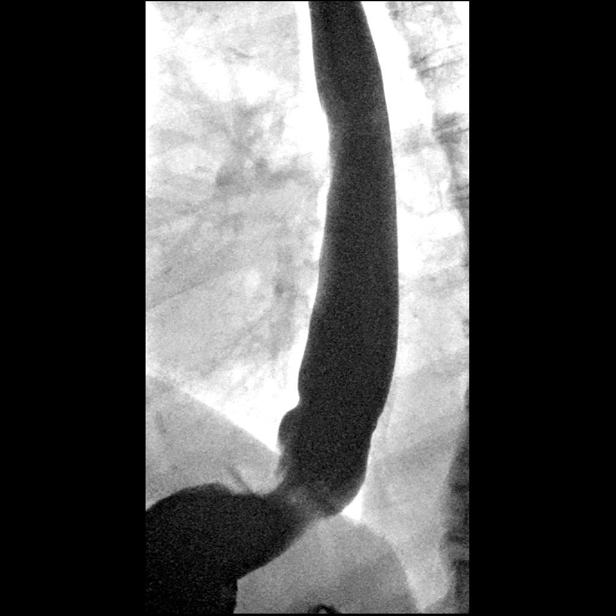

[Series 8: one shot · 0.14mm/px · 1 of 1 slices shown (2 of 2)]
[im 1/1]
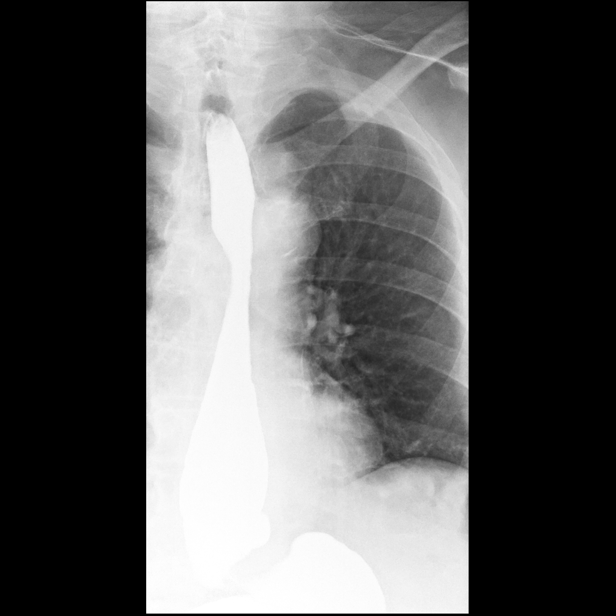

[14 of 24 positions shown; findings below may reference images not displayed]

FINDINGS: Normal oral and pharyngeal phases of swallowing, with no laryngeal
penetration or tracheobronchial aspiration. No significant barium
retention in the pharynx. No evidence of pharyngeal mass, stricture
or diverticulum. No significant cricopharyngeus muscle dysfunction.

Small sliding hiatal hernia. Severe spontaneous gastroesophageal
reflux observed to the level of the thoracic inlet. Mild granularity
of the thoracic esophageal mucosa suggesting mild reflux
esophagitis. Normal esophageal distensibility, with no evidence of
esophageal mass, ulcer or stricture. Patient declined the barium
tablet.
IMPRESSION: 1. Small sliding hiatal hernia. Severe spontaneous gastroesophageal
reflux.
2. Evidence of mild reflux esophagitis. No evidence of esophageal
mass or stricture, with limitations as described.
3. Normal oral and pharyngeal phases of swallowing.

## 2023-05-18 ENCOUNTER — Ambulatory Visit
Admission: RE | Admit: 2023-05-18 | Discharge: 2023-05-18 | Disposition: A | Discharge status: Home / Self Care | Payer: PPO | Source: Ambulatory Visit | Attending: Internal Medicine | Admitting: Internal Medicine

## 2023-05-18 DIAGNOSIS — R413 Other amnesia: Secondary | ICD-10-CM
